# Patient Record
Sex: Female | Born: 1950 | Race: White | Hispanic: No | Marital: Single | Smoking: Never smoker
Health system: Southern US, Community
[De-identification: ages and names within clinical notes are randomized; demographics above are authoritative.]

## PROBLEM LIST (undated history)

## (undated) DIAGNOSIS — R569 Unspecified convulsions: Secondary | ICD-10-CM

## (undated) DIAGNOSIS — F79 Unspecified intellectual disabilities: Secondary | ICD-10-CM

---

## 2008-09-25 ENCOUNTER — Ambulatory Visit: Payer: Self-pay | Admitting: Radiology

## 2008-09-25 ENCOUNTER — Emergency Department (HOSPITAL_BASED_OUTPATIENT_CLINIC_OR_DEPARTMENT_OTHER): Admission: EM | Admit: 2008-09-25 | Discharge: 2008-09-25 | Payer: Self-pay | Admitting: Emergency Medicine

## 2009-12-30 ENCOUNTER — Emergency Department (HOSPITAL_BASED_OUTPATIENT_CLINIC_OR_DEPARTMENT_OTHER): Admission: EM | Admit: 2009-12-30 | Discharge: 2009-12-30 | Payer: Self-pay | Admitting: Emergency Medicine

## 2009-12-30 ENCOUNTER — Ambulatory Visit: Payer: Self-pay | Admitting: Diagnostic Radiology

## 2014-03-24 DIAGNOSIS — G910 Communicating hydrocephalus: Secondary | ICD-10-CM | POA: Insufficient documentation

## 2014-03-24 DIAGNOSIS — F71 Moderate intellectual disabilities: Secondary | ICD-10-CM | POA: Insufficient documentation

## 2014-03-24 DIAGNOSIS — R413 Other amnesia: Secondary | ICD-10-CM | POA: Insufficient documentation

## 2014-03-24 DIAGNOSIS — R262 Difficulty in walking, not elsewhere classified: Secondary | ICD-10-CM | POA: Insufficient documentation

## 2020-07-10 DIAGNOSIS — R911 Solitary pulmonary nodule: Secondary | ICD-10-CM | POA: Insufficient documentation

## 2020-12-23 ENCOUNTER — Emergency Department (HOSPITAL_COMMUNITY): Payer: Medicare Other

## 2020-12-23 ENCOUNTER — Emergency Department (HOSPITAL_COMMUNITY)
Admission: EM | Admit: 2020-12-23 | Discharge: 2020-12-23 | Disposition: A | Discharge status: Home / Self Care | Payer: Medicare Other | Attending: Emergency Medicine | Admitting: Emergency Medicine

## 2020-12-23 ENCOUNTER — Other Ambulatory Visit: Payer: Self-pay

## 2020-12-23 DIAGNOSIS — S7012XA Contusion of left thigh, initial encounter: Secondary | ICD-10-CM | POA: Insufficient documentation

## 2020-12-23 DIAGNOSIS — R519 Headache, unspecified: Secondary | ICD-10-CM | POA: Insufficient documentation

## 2020-12-23 DIAGNOSIS — S79922A Unspecified injury of left thigh, initial encounter: Secondary | ICD-10-CM | POA: Diagnosis present

## 2020-12-23 DIAGNOSIS — W19XXXA Unspecified fall, initial encounter: Secondary | ICD-10-CM | POA: Diagnosis not present

## 2020-12-23 DIAGNOSIS — S50312A Abrasion of left elbow, initial encounter: Secondary | ICD-10-CM | POA: Diagnosis not present

## 2020-12-23 MED ORDER — ACETAMINOPHEN 325 MG PO TABS
325.0000 mg | ORAL_TABLET | Freq: Once | ORAL | Status: AC
  Administered 2020-12-23: 325 mg via ORAL
  Filled 2020-12-23: qty 1

## 2020-12-23 NOTE — ED Notes (Signed)
This NT assisted pt to BR. Pt ambulatory with assistance.

## 2020-12-23 NOTE — ED Notes (Signed)
Patient transported to CT 

## 2020-12-23 NOTE — ED Triage Notes (Signed)
Pt  Here from home with c/o fall , c/o left elbow pain and left leg pain , pt is developmentally delayed

## 2020-12-23 NOTE — ED Provider Notes (Signed)
[facility] EMERGENCY DEPARTMENT Provider Note   CSN: [id number] Arrival date & time: 12/23/20  1609     History No chief complaint on file.   Angela Caldwell is a 70 y.o. female.  She said that she caught her side on the ground.  Pain on the left side of her head, her left elbow, and her left thigh.  The history is provided by the patient. The history is limited by a developmental delay.  Fall This is a new problem. The current episode started less than 1 hour ago. The problem occurs constantly. The problem has not changed since onset.Associated symptoms include headaches. Pertinent negatives include no chest pain, no abdominal pain and no shortness of breath. The symptoms are aggravated by bending. The symptoms are relieved by rest. She has tried nothing for the symptoms. The treatment provided no relief.       No past medical history on file.  There are no problems to display for this patient.      OB History   No obstetric history on file.     No family history on file.     Home Medications Prior to Admission medications   Not on File    Allergies    Patient has no allergy information on record.  Review of Systems   Review of Systems  Constitutional: Negative for chills and fever.  HENT: Negative for ear pain and sore throat.   Eyes: Negative for pain and visual disturbance.  Respiratory: Negative for cough and shortness of breath.   Cardiovascular: Negative for chest pain and palpitations.  Gastrointestinal: Negative for abdominal pain and vomiting.  Genitourinary: Negative for dysuria and hematuria.  Musculoskeletal: Negative for arthralgias and back pain.  Skin: Negative for color change and rash.  Neurological: Positive for headaches. Negative for seizures and syncope.  All other systems reviewed and are negative.   Physical Exam Updated Vital Signs BP 137/79 (BP Location: Right Arm)   Pulse (!) 55   Temp 98.9 F (37.2 C)  (Oral)   Resp 16   SpO2 96%   Physical Exam Vitals and nursing note reviewed.  Constitutional:      General: She is not in acute distress.    Appearance: She is well-developed.  HENT:     Head: Normocephalic and atraumatic.     Nose: Nose normal.     Mouth/Throat:     Comments: No dental trauma Eyes:     Conjunctiva/sclera: Conjunctivae normal.  Cardiovascular:     Rate and Rhythm: Normal rate and regular rhythm.     Heart sounds: No murmur heard.   Pulmonary:     Effort: Pulmonary effort is normal. No respiratory distress.     Breath sounds: Normal breath sounds.  Abdominal:     Palpations: Abdomen is soft.     Tenderness: There is no abdominal tenderness.  Musculoskeletal:     Cervical back: Neck supple.     Comments: Pinpoint abrasion on the posterior of the olecranon on the left.  Elbow range of motion is full and painless.  No effusion.  Distal pulses normal.  Sensation is normal.  On the left thigh, there is a large, oval bruise encompassing most of the lateral aspect of the thigh.  Likewise, the patient has normal range of motion of the hip.  Distal pulses normal.  Sensation is normal.  Gait is normal.  Skin:    General: Skin is warm and dry.  Neurological:  General: No focal deficit present.     Mental Status: She is alert. Mental status is at baseline.  Psychiatric:        Mood and Affect: Mood normal.     ED Results / Procedures / Treatments   Labs (all labs ordered are listed, but only abnormal results are displayed) Labs Reviewed - No data to display  EKG None  Radiology DG Elbow Complete Left  Result Date: 12/23/2020 CLINICAL DATA:  Fall with left elbow pain. EXAM: LEFT ELBOW - COMPLETE 3+ VIEW COMPARISON:  None. FINDINGS: Lateral view is limited by positioning which limits assessment for joint effusion. No large elbow joint effusion is seen. No evidence of acute fracture. Mild degenerative change with spurring. No dislocation. No focal soft tissue  abnormality. IMPRESSION: No acute fracture or subluxation of the left elbow. Mild degenerative change. Electronically Signed   By: Narda Rutherford M.D.   On: 12/23/2020 18:14   CT Head Wo Contrast  Result Date: 12/23/2020 CLINICAL DATA:  Fall EXAM: CT HEAD WITHOUT CONTRAST TECHNIQUE: Contiguous axial images were obtained from the base of the skull through the vertex without intravenous contrast. COMPARISON:  CT brain 08/29/2020, 10/11/2018, 02/02/2017 FINDINGS: Brain: No acute territorial infarction, hemorrhage or intracranial mass is visualized. 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 Moderate severe ventricular enlargement of the lateral and third ventricles with normal size fourth ventricle. 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 No midline shift. Crowding of structures at the foramen magnum. Vascular: No hyperdense vessels.  No unexpected calcification. Skull: No fracture Sinuses/Orbits: No acute finding. Other: None. IMPRESSION: 1. No CT evidence for acute intracranial abnormality. 2. 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 Electronically Signed   By: Jasmine Pang M.D.   On: 12/23/2020 17:42   DG Femur Min 2 Views Left  Result Date: 12/23/2020 CLINICAL DATA:  Status post fall. EXAM: LEFT FEMUR 2 VIEWS COMPARISON:  None. FINDINGS: There is no evidence of fracture or other focal bone lesions. Soft tissues are unremarkable. IMPRESSION: Negative. Electronically Signed   By: Signa Kell M.D.   On: 12/23/2020 18:20    Procedures Procedures   Medications Ordered in ED Medications  acetaminophen (TYLENOL) tablet 325 mg (has no administration in time range)    ED Course  I have reviewed the triage vital signs and  the nursing notes.  Pertinent labs & imaging results that were available during my care of the patient were reviewed by me and considered in my medical decision making (see chart for details).    MDM Rules/Calculators/A&P                          Cathlyn Tersigni sustained a mechanical fall and complained of pain in her head, pain in her left elbow, and pain in her thigh.  ED evaluation was negative for acute trauma.  She will be discharged home. Final Clinical Impression(s) / ED Diagnoses Final diagnoses:  Fall, initial encounter  Abrasion of left elbow, initial encounter  Contusion of left thigh, initial encounter    Rx / DC Orders ED Discharge Orders    None       Koleen Distance, MD 12/23/20 [phone]

## 2022-05-17 IMAGING — CT CT HEAD W/O CM
3 series · 14 of 47 positions shown, 16 images · non-contrast
Comparison: CT brain 08/29/2020, 10/11/2018, 02/02/2017

CLINICAL DATA: Fall

EXAM:
CT HEAD WITHOUT CONTRAST
TECHNIQUE: Contiguous axial images were obtained from the base of the skull
through the vertex without intravenous contrast.

[Series 3: head 5.0 h30s · axial · 0.43mm/px · z∈[-133,+2]mm · 8 of 33 slices shown, 10 images]
[im 3/33  brain]
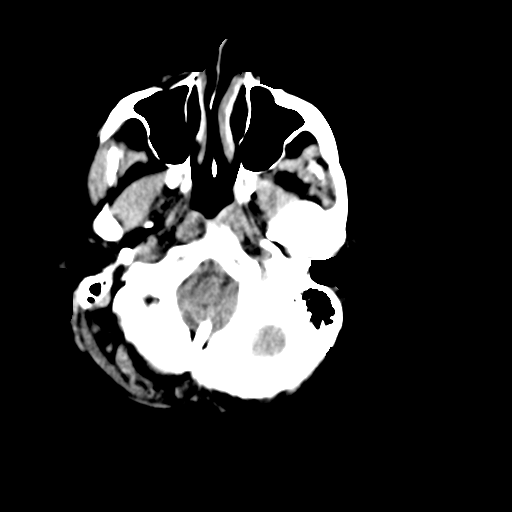
[im 3/33  bone]
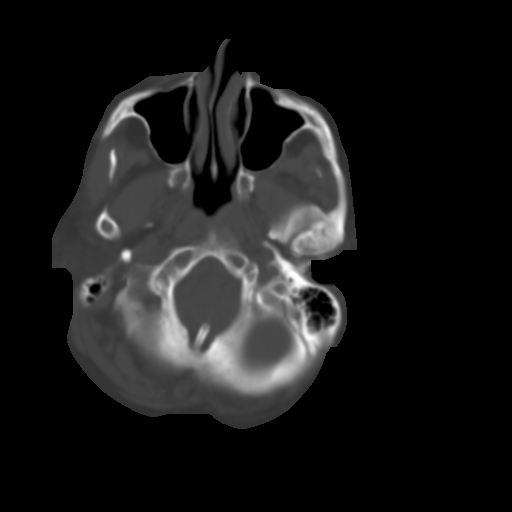
[im 7/33  brain]
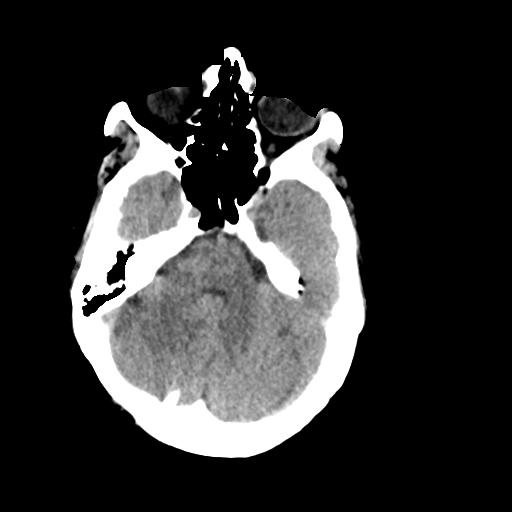
[im 10/33  brain]
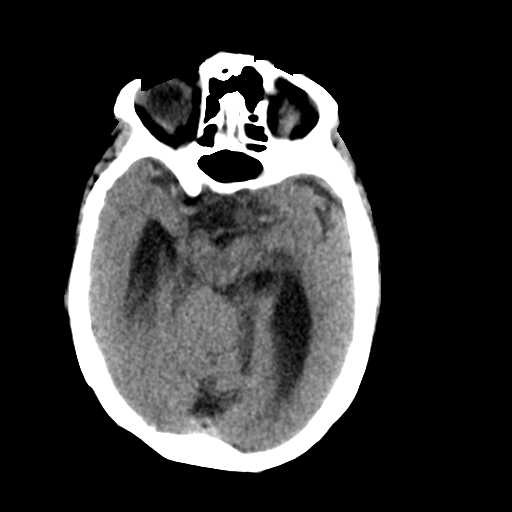
[im 15/33  brain]
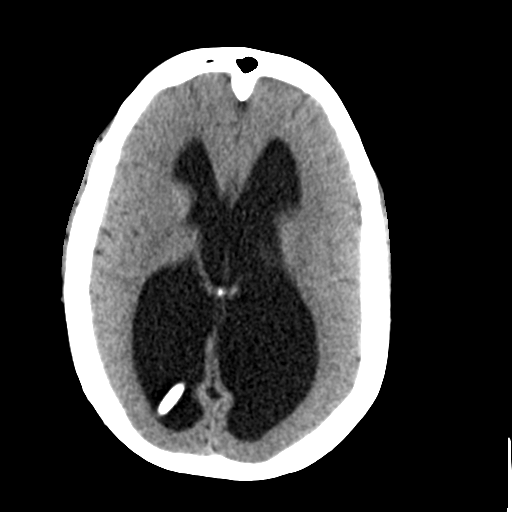
[im 18/33  brain]
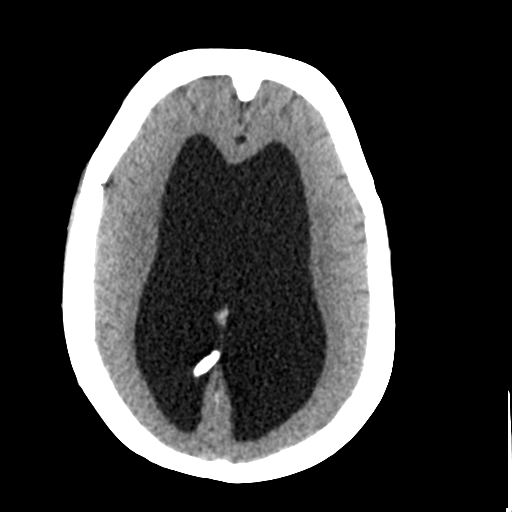
[im 18/33  bone]
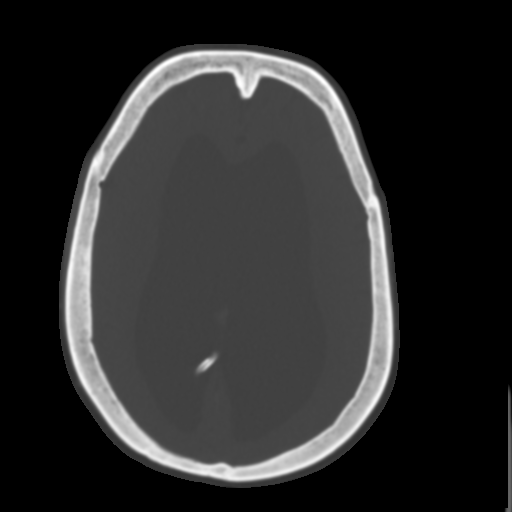
[im 23/33  brain]
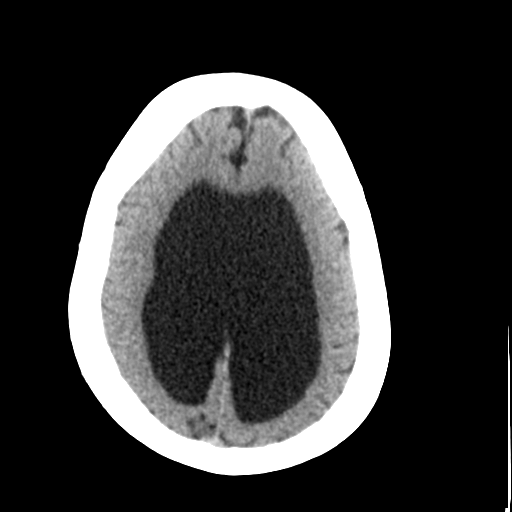
[im 26/33  brain]
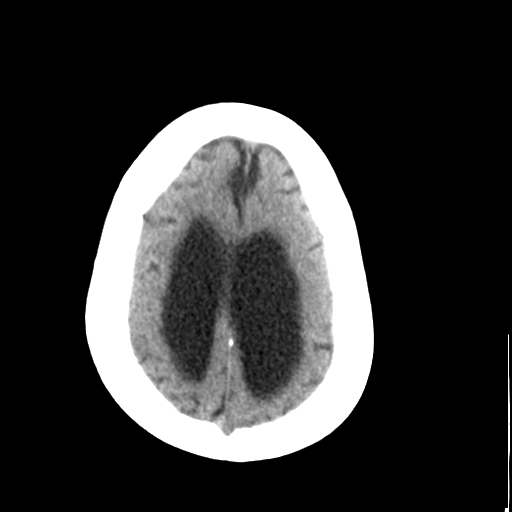
[im 30/33  brain]
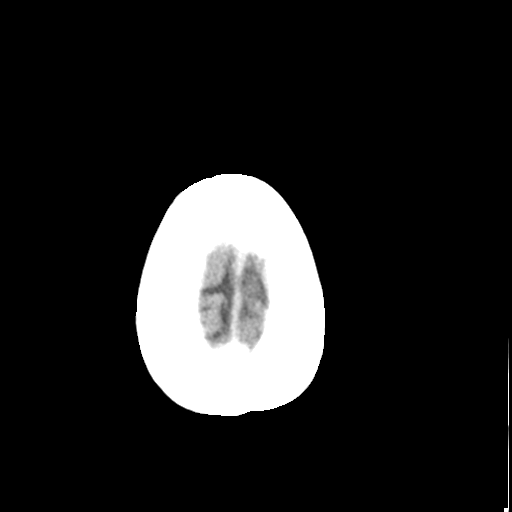

[Series 5: head 3.0 mpr cor · coronal · 0.32mm/px · 3 of 72 slices shown]
[im 24/72  brain]
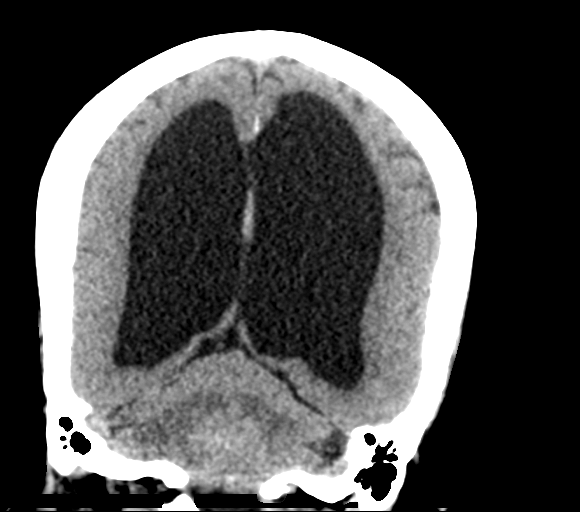
[im 32/72  brain]
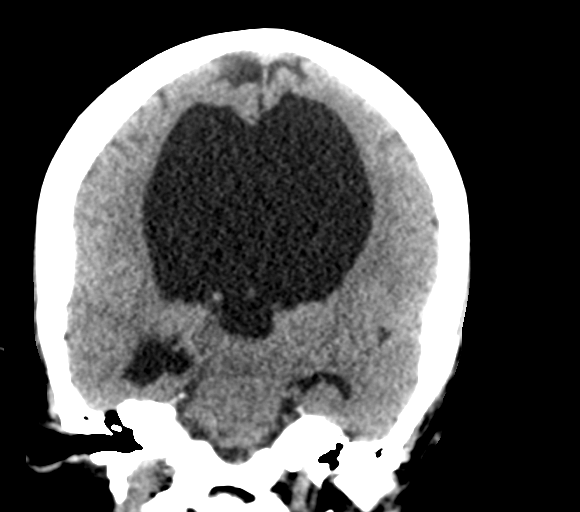
[im 40/72  brain]
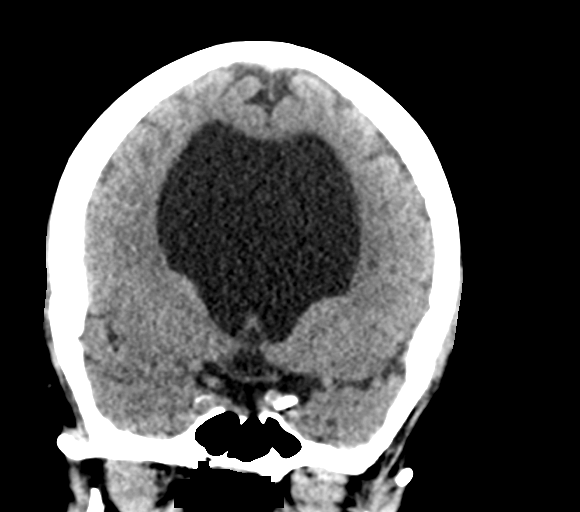

[Series 6: head 3.0 mpr sag · sagittal · 0.32mm/px · 3 of 66 slices shown]
[im 22/66  brain]
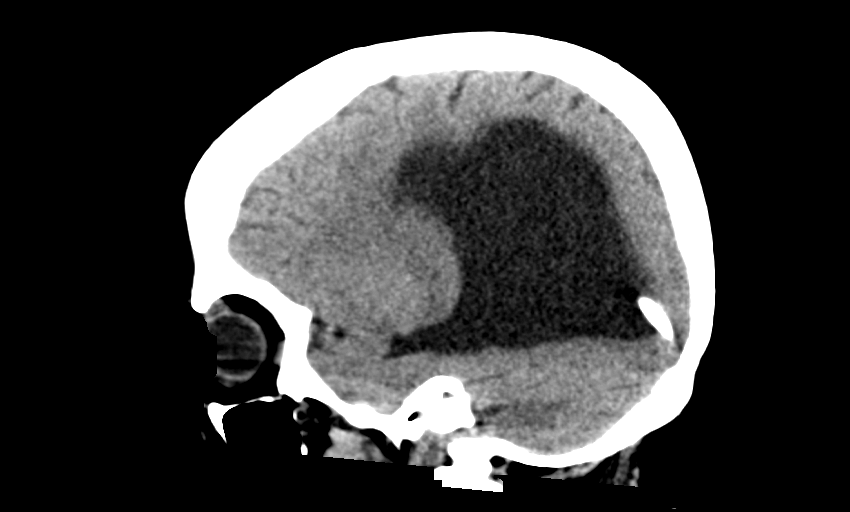
[im 33/66  brain]
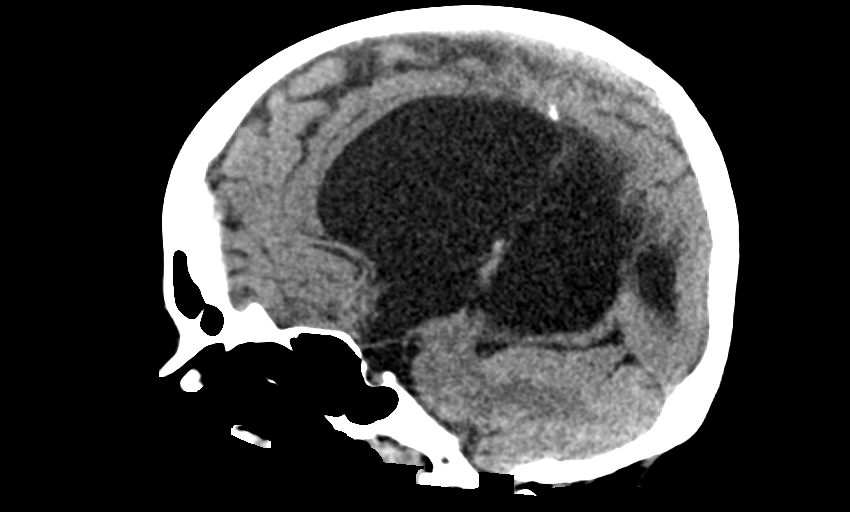
[im 44/66  brain]
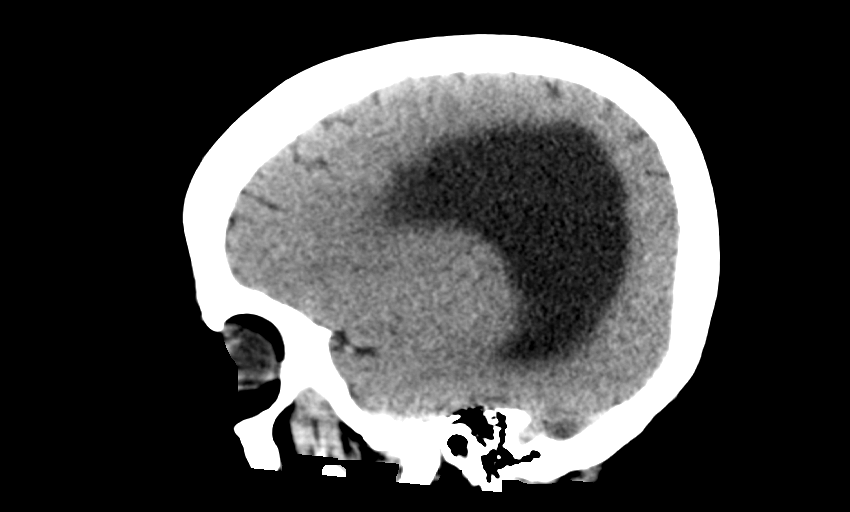

[14 of 47 positions shown; findings below may reference images not displayed]

FINDINGS: Brain: No acute territorial infarction, hemorrhage or intracranial
mass is visualized. Similar positioning of right intracranial shunt
catheter with cranial tip terminating in the posterior right lateral
ventricle and distal tip terminating just beyond foramen magnum.
Moderate severe ventricular enlargement of the lateral and third
ventricles with normal size fourth ventricle. Absent septum
pellucidum as before. No midline shift. Crowding of structures at
the foramen magnum.

Vascular: No hyperdense vessels.  No unexpected calcification.

Skull: No fracture

Sinuses/Orbits: No acute finding.

Other: None.
IMPRESSION: 1. No CT evidence for acute intracranial abnormality.
2. Similar positioning of right intracranial shunt catheter with
moderate to severe ventricular enlargement of the lateral and third
ventricles, chronic. Absent septum pellucidum as before.

## 2023-02-11 ENCOUNTER — Emergency Department (HOSPITAL_COMMUNITY): Payer: Medicare Other

## 2023-02-11 ENCOUNTER — Emergency Department (HOSPITAL_COMMUNITY)
Admission: EM | Admit: 2023-02-11 | Discharge: 2023-02-11 | Disposition: A | Discharge status: Home / Self Care | Payer: Medicare Other | Attending: Emergency Medicine | Admitting: Emergency Medicine

## 2023-02-11 ENCOUNTER — Other Ambulatory Visit: Payer: Self-pay

## 2023-02-11 DIAGNOSIS — S52591A Other fractures of lower end of right radius, initial encounter for closed fracture: Secondary | ICD-10-CM | POA: Diagnosis not present

## 2023-02-11 DIAGNOSIS — S0990XA Unspecified injury of head, initial encounter: Secondary | ICD-10-CM | POA: Diagnosis not present

## 2023-02-11 DIAGNOSIS — S6991XA Unspecified injury of right wrist, hand and finger(s), initial encounter: Secondary | ICD-10-CM | POA: Diagnosis present

## 2023-02-11 DIAGNOSIS — W010XXA Fall on same level from slipping, tripping and stumbling without subsequent striking against object, initial encounter: Secondary | ICD-10-CM | POA: Insufficient documentation

## 2023-02-11 DIAGNOSIS — M25551 Pain in right hip: Secondary | ICD-10-CM | POA: Diagnosis not present

## 2023-02-11 LAB — COMPREHENSIVE METABOLIC PANEL
ALT: 13 U/L (ref 0–44)
AST: 16 U/L (ref 15–41)
Albumin: 3.8 g/dL (ref 3.5–5.0)
Alkaline Phosphatase: 74 U/L (ref 38–126)
Anion gap: 8 (ref 5–15)
BUN: 12 mg/dL (ref 8–23)
CO2: 25 mmol/L (ref 22–32)
Calcium: 8.8 mg/dL — ABNORMAL LOW (ref 8.9–10.3)
Chloride: 105 mmol/L (ref 98–111)
Creatinine, Ser: 0.65 mg/dL (ref 0.44–1.00)
GFR, Estimated: >60 mL/min (ref >60)
Glucose, Bld: 116 mg/dL — ABNORMAL HIGH (ref 70–99)
Potassium: 4 mmol/L (ref 3.5–5.1)
Sodium: 138 mmol/L (ref 135–145)
Total Bilirubin: 0.6 mg/dL (ref 0.3–1.2)
Total Protein: 6.9 g/dL (ref 6.5–8.1)

## 2023-02-11 LAB — CBC
HCT: 39.8 % (ref 36.0–46.0)
Hemoglobin: 13.4 g/dL (ref 12.0–15.0)
MCH: 31.2 pg (ref 26.0–34.0)
MCHC: 33.7 g/dL (ref 30.0–36.0)
MCV: 92.6 fL (ref 80.0–100.0)
Platelets: 205 10*3/uL (ref 150–400)
RBC: 4.3 MIL/uL (ref 3.87–5.11)
RDW: 12.6 % (ref 11.5–15.5)
WBC: 6.5 10*3/uL (ref 4.0–10.5)
nRBC: 0 % (ref 0.0–0.2)

## 2023-02-11 MED ORDER — ACETAMINOPHEN 325 MG PO TABS
650.0000 mg | ORAL_TABLET | Freq: Once | ORAL | Status: AC
  Administered 2023-02-11: 650 mg via ORAL
  Filled 2023-02-11: qty 2

## 2023-02-11 NOTE — Discharge Instructions (Addendum)
It was a pleasure caring for you today. Xray was concerning for right radial fracture. You were placed in a wrist splint today and will need follow up with orthopedic doctor within the next week.  Head imaging was without concern for intracranial bleeding or fractures.   Seek emergency care if experiencing any new or worsening symptoms.

## 2023-02-11 NOTE — Progress Notes (Signed)
Orthopedic Tech Progress Note Patient Details:  Angela Caldwell August 06, 1951 [id number]  Ortho Devices Type of Ortho Device: Sugartong splint Ortho Device/Splint Location: RUE Ortho Device/Splint Interventions: Ordered, Application, Adjustment   Post Interventions Patient Tolerated: Well Instructions Provided: Care of device Patient somewhat difficult to splint as they had some dorsal angulation that prevented me from getting them in the correct anatomical position. Spoke with EDP and they did not plan to reduce, so the wrist was splinted as is.  [name] 02/11/2023, 9:21 PM

## 2023-02-11 NOTE — ED Triage Notes (Addendum)
Per EMS, Pt c/o R hand and R forehead pain r/t a trip and fall this afternoon.  Denies LOC and blood thinners.  Pt has developmental delay and is a poor historian. Pt's sister is on her way.

## 2023-02-11 NOTE — ED Provider Notes (Signed)
[place] EMERGENCY DEPARTMENT AT [facility] Provider Note   CSN: [id number] Arrival date & time: 02/11/23  1812     History  Chief Complaint  Patient presents with   Fall   Hand Pain   Head Injury    Sapna Florence is a 72 y.o. female with PMHx developmental delay who presents to ED after fall complaining of right wrist pain and right hip pain. Patient's sister stating that patient tripped, stumbled and fell onto right side.  Denies chest pain, dyspnea, abdominal pain, vomiting, nausea, loss of consciousness, seizures   Fall  Hand Pain  Head Injury      Home Medications Prior to Admission medications   Not on File      Allergies    Patient has no allergy information on record.    Review of Systems   Review of Systems  Musculoskeletal:        Wrist pain    Physical Exam Updated Vital Signs BP 135/73   Pulse 79   Temp 98.2 F (36.8 C) (Oral)   Resp 18   SpO2 96%  Physical Exam Vitals and nursing note reviewed.  Constitutional:      General: She is not in acute distress. HENT:     Head: Normocephalic and atraumatic.     Mouth/Throat:     Mouth: Mucous membranes are moist.     Pharynx: No oropharyngeal exudate or posterior oropharyngeal erythema.  Eyes:     General: No scleral icterus.       Right eye: No discharge.        Left eye: No discharge.     Conjunctiva/sclera: Conjunctivae normal.  Cardiovascular:     Rate and Rhythm: Normal rate and regular rhythm.     Pulses: Normal pulses.     Heart sounds: Normal heart sounds. No murmur heard. Pulmonary:     Effort: Pulmonary effort is normal. No respiratory distress.     Breath sounds: Normal breath sounds. No wheezing, rhonchi or rales.  Abdominal:     Tenderness: There is no abdominal tenderness.  Musculoskeletal:     Right lower leg: No edema.     Left lower leg: No edema.     Comments: Right hand appears well vascularized with +2 radial pulse and brisk capillary refill   Skin:    General: Skin is warm and dry.     Findings: No rash.  Neurological:     General: No focal deficit present.     Mental Status: She is alert. Mental status is at baseline.     Cranial Nerves: No cranial nerve deficit.     Sensory: No sensory deficit.     Motor: No weakness.     Comments: GCS 15. Speech is goal oriented. No deficits appreciated to CN III-XII; symmetric eyebrow raise, no facial drooping, tongue midline. Sensation to light touch intact. Patient moves extremities without ataxia.    Psychiatric:        Mood and Affect: Mood normal.        Behavior: Behavior normal.     ED Results / Procedures / Treatments   Labs (all labs ordered are listed, but only abnormal results are displayed) Labs Reviewed  COMPREHENSIVE METABOLIC PANEL - Abnormal; Notable for the following components:      Result Value   Glucose, Bld 116 (*)    Calcium 8.8 (*)    All other components within normal limits  CBC    EKG None  Radiology  DG Hand Complete Right  Result Date: 02/11/2023 CLINICAL DATA:  Right hip pain after a fall. Right wrist pain after a fall. Trip and fall this afternoon. EXAM: RIGHT HAND - COMPLETE 3+ VIEW COMPARISON:  Right wrist 02/11/2023 FINDINGS: Transverse fractures of the distal right radius. See additional report of right wrist obtained same day. Degenerative changes in the interphalangeal joints. No additional fractures are identified. Soft tissues are unremarkable. IMPRESSION: 1. Transverse fractures of the distal right radius. See additional report of right wrist. 2. Degenerative changes in the right hand. No additional fractures identified. Electronically Signed   By: Burman Nieves M.D.   On: 02/11/2023 20:05   DG Wrist Complete Right  Result Date: 02/11/2023 CLINICAL DATA:  Right wrist pain after trip and fall injury this afternoon. EXAM: RIGHT WRIST - COMPLETE 3+ VIEW COMPARISON:  None Available. FINDINGS: Transverse fractures of the distal right radial  metaphysis with fracture lines likely extending to the radioulnar joint. No definite extension to the radiocarpal joint. Fracture lines appear impacted. Mild dorsal angulation of the distal fracture fragments. There is overlying soft tissue swelling. IMPRESSION: Mostly transverse impacted fractures of the distal right radial metaphysis. Electronically Signed   By: Burman Nieves M.D.   On: 02/11/2023 20:04   DG Pelvis 1-2 Views  Result Date: 02/11/2023 CLINICAL DATA:  Trip and fall injury this afternoon.  Pain. 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 No evidence of acute fracture or dislocation of the pelvis. No focal bone lesion or bone destruction. SI joints and symphysis pubis are not displaced. Vascular calcifications. IMPRESSION: No acute bony abnormalities. Electronically Signed   By: Burman Nieves M.D.   On: 02/11/2023 20:01   CT Head Wo Contrast  Result Date: 02/11/2023 CLINICAL DATA:  Trip and fall EXAM: CT HEAD WITHOUT CONTRAST CT CERVICAL SPINE WITHOUT CONTRAST TECHNIQUE: Multidetector CT imaging of the head and cervical spine was performed following the standard protocol without intravenous contrast. Multiplanar CT image reconstructions of the cervical spine were also generated. RADIATION DOSE REDUCTION: This exam was performed according to the departmental dose-optimization program which includes automated exposure control, adjustment of the mA and/or kV according to patient size and/or use of iterative reconstruction technique. COMPARISON:  02/28/2022 CT head, 12/18/2021 CT cervical spine FINDINGS: CT HEAD FINDINGS Brain: No evidence of acute infarct, hemorrhage, mass, mass effect, or midline shift. No hydrocephalus or extra-axial fluid collection. Unchanged severe hydrocephalus, with right posterior ventriculostomy catheter again noted. Vascular: No hyperdense vessel.  Skull: Negative for fracture or focal lesion. Sinuses/Orbits: No acute finding. Other: The mastoid air cells are well aerated. CT CERVICAL SPINE FINDINGS Alignment: No traumatic listhesis. Skull base and vertebrae: No acute fracture or suspicious osseous lesion. Fusion of C3 and C4, which may be congenital or degenerative. Soft tissues and spinal canal: No prevertebral fluid or swelling. No visible canal hematoma. Disc levels: Degenerative changes in the cervical spine. No significant spinal canal stenosis. Upper chest: Negative. IMPRESSION: 1. No acute intracranial process. 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 2. No acute fracture or traumatic listhesis in the cervical spine. Electronically Signed   By: Wiliam Ke M.D.   On: 02/11/2023 19:47   CT Cervical Spine Wo Contrast  Result Date: 02/11/2023 CLINICAL DATA:  Trip and fall EXAM: CT HEAD WITHOUT CONTRAST CT CERVICAL SPINE WITHOUT CONTRAST TECHNIQUE: Multidetector CT imaging of the head and cervical spine was  performed following the standard protocol without intravenous contrast. Multiplanar CT image reconstructions of the cervical spine were also generated. RADIATION DOSE REDUCTION: This exam was performed according to the departmental dose-optimization program which includes automated exposure control, adjustment of the mA and/or kV according to patient size and/or use of iterative reconstruction technique. COMPARISON:  02/28/2022 CT head, 12/18/2021 CT cervical spine FINDINGS: CT HEAD FINDINGS Brain: No evidence of acute infarct, hemorrhage, mass, mass effect, or midline shift. No hydrocephalus or extra-axial fluid collection. Unchanged severe hydrocephalus, with right posterior ventriculostomy catheter again noted. Vascular: No hyperdense vessel. Skull: Negative for fracture or focal lesion. Sinuses/Orbits: No acute finding. Other: The mastoid air cells are well aerated. CT CERVICAL SPINE FINDINGS Alignment: No  traumatic listhesis. Skull base and vertebrae: No acute fracture or suspicious osseous lesion. Fusion of C3 and C4, which may be congenital or degenerative. Soft tissues and spinal canal: No prevertebral fluid or swelling. No visible canal hematoma. Disc levels: Degenerative changes in the cervical spine. No significant spinal canal stenosis. Upper chest: Negative. IMPRESSION: 1. No acute intracranial process. 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 2. No acute fracture or traumatic listhesis in the cervical spine. Electronically Signed   By: Wiliam Ke M.D.   On: 02/11/2023 19:47    Procedures Procedures    Medications Ordered in ED Medications  acetaminophen (TYLENOL) tablet 650 mg (650 mg Oral Given 02/11/23 1936)    ED Course/ Medical Decision Making/ A&P                             Medical Decision Making Amount and/or Complexity of Data Reviewed Labs: ordered. Radiology: ordered.  Risk OTC drugs.   This patient presents to the ED after a fall, this involves an extensive number of treatment options, and is a complaint that carries with it a high risk of complications and morbidity.  The differential diagnosis includes  intracranial hemorrhage, subdural/epidural hematoma, vertebral fracture, spinal cord injury, muscle strain, skull fracture, fracture.   Co morbidities that complicate the patient evaluation  Developmental disorder     Lab Tests:  I Ordered, and personally interpreted labs.  The pertinent results include:   -CMP: no concern for electrolyte abnormality; no concern for kidney/liver damage -CBC: No concern for anemia or leukocytosis    Imaging Studies ordered:  I ordered imaging studies including  -CT head/cervical spine: No acute intracranial process. 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 No acute fracture or traumatic listhesis in the cervical spine. -Hip xray: No acute bony  abnormalities.  -Wrist xray: Mostly transverse impacted fractures of the distal right radial metaphysis. -hand xray: Degenerative changes in the right hand. No additional fractures identified. I independently visualized and interpreted imaging I agree with the radiologist interpretation     Problem List / ED Course / Critical interventions / Medication management  Patient presented for Fall. Complaining of right wrist and right hip pain. Patient with stable vitals and does not appear to be in distress. Patient with unremarkable neuro exam.  CT without concerns for intracranial bleeding or fractures.  Patient complaining of right wrist pain and right hip pain.  Hip x-ray without concern for fracture.  Right wrist x-ray concerning for transverse impacted fracture of distal right radial metaphysis.  Right sugar-tong splint applied to right wrist.  After application of splint patient's right hand appeared neurovascularly intact. Provided patient and her sister with information to follow-up with hand surgeon.  Educated patient and sister on return precautions to include swelling of right hand, blue fingers, numbness of right hand.  Patient's sister verbally endorsed understanding of plan. I have reviewed the patients home medicines and have made adjustments as needed Patient was given return precautions. Patient stable for discharge at this time. Patient verbalized understanding of plan.   DDx: These are considered less likely due to history of present illness and physical exam findings -Intracranial hemorrhage, subdural/epidural hematoma: Canadian head CT score of 0, no neurodeficits -Vertebral fracture: No seatbelt sign, no midline tenderness, no step-off/crepitus/abnormalities palpated -Spinal cord injury: Nexus C-spine and Canadian head CT score of 0, no neurodeficits -Skull fracture: No postauricular ecchymosis, no periorbital ecchymosis, no hemotympanum -Fracture: No  step-offs/crepitus/abnormalities palpated in head, neck, chest, upper extremities, lower extremities, pelvis  Risk Stratification Score:  Nexus C-spine: imaging obtained Canadian Head CT: imaging obtained   Social Determinants of Health:  Developmental delay              Final Clinical Impression(s) / ED Diagnoses Final diagnoses:  Other closed fracture of distal end of right radius, initial encounter    Rx / DC Orders ED Discharge Orders     None         Margarita Rana 02/11/23 2141    Benjiman Core, MD 02/12/23 0003

## 2023-02-18 ENCOUNTER — Encounter (HOSPITAL_COMMUNITY): Payer: Self-pay

## 2023-02-18 ENCOUNTER — Emergency Department (HOSPITAL_COMMUNITY)
Admission: EM | Admit: 2023-02-18 | Discharge: 2023-02-18 | Disposition: A | Discharge status: Home / Self Care | Payer: Medicare Other | Attending: Emergency Medicine | Admitting: Emergency Medicine

## 2023-02-18 ENCOUNTER — Other Ambulatory Visit: Payer: Self-pay

## 2023-02-18 DIAGNOSIS — R0789 Other chest pain: Secondary | ICD-10-CM | POA: Insufficient documentation

## 2023-02-18 HISTORY — DX: Unspecified convulsions: R56.9

## 2023-02-18 HISTORY — DX: Unspecified intellectual disabilities: F79

## 2023-02-18 LAB — CBC WITH DIFFERENTIAL/PLATELET
Abs Immature Granulocytes: 0.03 10*3/uL (ref 0.00–0.07)
Basophils Absolute: 0.1 10*3/uL (ref 0.0–0.1)
Basophils Relative: 1 %
Eosinophils Absolute: 0.2 10*3/uL (ref 0.0–0.5)
Eosinophils Relative: 2 %
HCT: 39.2 % (ref 36.0–46.0)
Hemoglobin: 13.2 g/dL (ref 12.0–15.0)
Immature Granulocytes: 0 %
Lymphocytes Relative: 36 %
Lymphs Abs: 2.6 10*3/uL (ref 0.7–4.0)
MCH: 30.8 pg (ref 26.0–34.0)
MCHC: 33.7 g/dL (ref 30.0–36.0)
MCV: 91.6 fL (ref 80.0–100.0)
Monocytes Absolute: 0.7 10*3/uL (ref 0.1–1.0)
Monocytes Relative: 10 %
Neutro Abs: 3.7 10*3/uL (ref 1.7–7.7)
Neutrophils Relative %: 51 %
Platelets: 239 10*3/uL (ref 150–400)
RBC: 4.28 MIL/uL (ref 3.87–5.11)
RDW: 12.6 % (ref 11.5–15.5)
WBC: 7.3 10*3/uL (ref 4.0–10.5)
nRBC: 0 % (ref 0.0–0.2)

## 2023-02-18 LAB — BASIC METABOLIC PANEL
Anion gap: 9 (ref 5–15)
BUN: 10 mg/dL (ref 8–23)
CO2: 23 mmol/L (ref 22–32)
Calcium: 8.6 mg/dL — ABNORMAL LOW (ref 8.9–10.3)
Chloride: 101 mmol/L (ref 98–111)
Creatinine, Ser: 0.68 mg/dL (ref 0.44–1.00)
GFR, Estimated: >60 mL/min (ref >60)
Glucose, Bld: 75 mg/dL (ref 70–99)
Potassium: 3.8 mmol/L (ref 3.5–5.1)
Sodium: 133 mmol/L — ABNORMAL LOW (ref 135–145)

## 2023-02-18 LAB — TROPONIN I (HIGH SENSITIVITY): Troponin I (High Sensitivity): 2 ng/L (ref <18)

## 2023-02-18 MED ORDER — ASPIRIN 81 MG PO CHEW
162.0000 mg | CHEWABLE_TABLET | Freq: Once | ORAL | Status: AC
  Administered 2023-02-18: 162 mg via ORAL
  Filled 2023-02-18: qty 2

## 2023-02-18 MED ORDER — ALUM & MAG HYDROXIDE-SIMETH 200-200-20 MG/5ML PO SUSP
30.0000 mL | Freq: Once | ORAL | Status: AC
  Administered 2023-02-18: 30 mL via ORAL
  Filled 2023-02-18: qty 30

## 2023-02-18 MED ORDER — ONDANSETRON 4 MG PO TBDP
ORAL_TABLET | ORAL | Status: AC
  Administered 2023-02-18: 4 mg
  Filled 2023-02-18: qty 1

## 2023-02-18 MED ORDER — ONDANSETRON HCL 4 MG/2ML IJ SOLN
4.0000 mg | Freq: Once | INTRAMUSCULAR | Status: DC
  Filled 2023-02-18: qty 2

## 2023-02-18 MED ORDER — ACETAMINOPHEN 325 MG PO TABS
650.0000 mg | ORAL_TABLET | Freq: Once | ORAL | Status: AC
  Administered 2023-02-18: 650 mg via ORAL
  Filled 2023-02-18: qty 2

## 2023-02-18 NOTE — ED Provider Notes (Signed)
[place] EMERGENCY DEPARTMENT AT [facility] Provider Note   CSN: [id number] Arrival date & time: 02/18/23  4098     History  Chief Complaint  Patient presents with   Chest Pain   Rib Injury    Angela Caldwell is a 72 y.o. female.  HPI  Pt comes in with cc of chest discomfort. Patient comes from group home with her sister at the bedside.  According to the patient's sister, patient was complaining of chest pain all day at the nursing home, therefore they sent her to the emergency room.  Patient currently indicates that she is having mostly right-sided arm pain and that also right-sided chest pain.  She states that the pain has been constant.  Arm pain is worse.  She denies any exertional component to the pain.  She denies any cough, fevers, chills, shortness of breath.  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  Home Medications Prior to Admission medications   Not on File      Allergies    Patient has no known allergies.    Review of Systems   Review of Systems  Physical Exam Updated Vital Signs BP 135/84   Pulse 69   Temp 98.2 F (36.8 C) (Oral)   Resp 20   SpO2 98%  Physical Exam Vitals and nursing note reviewed.  Constitutional:      Appearance: She is well-developed.  HENT:     Head: Normocephalic and atraumatic.  Eyes:     Extraocular Movements: Extraocular movements intact.  Cardiovascular:     Rate and Rhythm: Normal rate.     Heart sounds: Normal heart sounds.  Pulmonary:     Effort: Pulmonary effort is normal.  Musculoskeletal:     Cervical back: Normal range of motion and neck supple.  Skin:    General: Skin is dry.  Neurological:     Mental Status: She is alert and oriented to person, place, and time.     ED Results / Procedures / Treatments   Labs (all labs ordered are listed, but only abnormal results are displayed) Labs Reviewed  BASIC METABOLIC PANEL - Abnormal; Notable for  the following components:      Result Value   Sodium 133 (*)    Calcium 8.6 (*)    All other components within normal limits  CBC WITH DIFFERENTIAL/PLATELET  TROPONIN I (HIGH SENSITIVITY)  TROPONIN I (HIGH SENSITIVITY)    EKG EKG Interpretation  Date/Time:  Monday February 18 2023 19:30:04 EDT Ventricular Rate:  67 PR Interval:  118 QRS Duration: 80 QT Interval:  452 QTC Calculation: 478 R Axis:   71 Text Interpretation: Sinus rhythm Borderline short PR interval Low voltage, precordial leads Abnormal inferior Q waves Nonspecific T abnrm, anterolateral leads No old tracing to compare Confirmed by Derwood Kaplan [phone]) on 02/18/2023 9:00:55 PM   EKG Interpretation  Date/Time:  Monday February 18 2023 21:08:45 EDT Ventricular Rate:  57 PR Interval:  103 QRS Duration: 84 QT Interval:  447 QTC Calculation: 436 R Axis:   63 Text Interpretation: Sinus rhythm Short PR interval Low voltage, precordial leads No acute changes Confirmed by Derwood Kaplan [phone]) on 02/18/2023 10:43:58 PM         Radiology No results found.  Procedures Procedures    Medications Ordered in ED Medications  ondansetron (ZOFRAN) injection 4 mg (4 mg Intravenous Patient Refused/Not Given 02/18/23 2136)  aspirin chewable tablet 162 mg (162 mg Oral Given 02/18/23  2136)  alum & mag hydroxide-simeth (MAALOX/MYLANTA) 200-200-20 MG/5ML suspension 30 mL (30 mLs Oral Given 02/18/23 2136)  acetaminophen (TYLENOL) tablet 650 mg (650 mg Oral Given 02/18/23 2136)  ondansetron (ZOFRAN-ODT) 4 MG disintegrating tablet (4 mg  Given 02/18/23 2137)    ED Course/ Medical Decision Making/ A&P                             Medical Decision Making Amount and/or Complexity of Data Reviewed Labs: ordered.  Risk OTC drugs. Prescription drug management.   72 year old patient comes in with chief complaint of chest pain.  Patient is not the best historian, sister is at the bedside.  Patient has no known history of CAD.  She  does not smoke, have any hypertension or hyperlipidemia.  There is family history of CAD however.  Patient has been having chest pain since this morning.  Plan is to get single troponin, if it is negative then patient can be discharged.  Patient's hear score is less than 3.  Differential diagnosis essentially includes ACS, GERD, musculoskeletal pain.  Additionally patient is primarily complaining of right arm pain, she already has known history of fracture there.  Final Clinical Impression(s) / ED Diagnoses Final diagnoses:  Atypical chest pain    Rx / DC Orders ED Discharge Orders     None         Derwood Kaplan, MD 02/18/23 2250

## 2023-02-18 NOTE — Discharge Instructions (Signed)

## 2023-02-18 NOTE — ED Notes (Addendum)
Pt caregiver had to return to group home, stated that pt sister is her POA, and can be called for any questions regarding medical care. Spoke with sister, Steward Drone, and she states that she will be on her way to the ED soon

## 2023-02-18 NOTE — ED Triage Notes (Addendum)
Pt arrives with caregiver from group home for chest pain and right side rib pain from a fall 2 days ago.

## 2024-03-20 ENCOUNTER — Encounter: Payer: Self-pay | Admitting: Advanced Practice Midwife

## 2024-05-05 NOTE — ED Provider Notes (Signed)
 [facility] Emergency Department Provider Note  MRN: [id number] Chief Complaint:  Chief Complaint  Patient presents with  . Fall  . Laceration    History and Review of Systems  Angela Caldwell is a 73 y.o. year-old female with a medical history discussed below presenting to the ED with chief complaint as above.   Patient brought in by EMS from group home who assisted with patient history. EMS report that the patient was found on the floor of her bathroom with a laceration to the right side of her head. Staff report that the patient is at her baseline, but notes that her speech is not slurred, but does sound different. EMS state that the patient did not have any other focal deficits since arrival on scene. EMS endorses a history of intellectual disability. They deny any history of anticoagulant use in the patient. EMS note that the patient seems to not be opening her mouth all the way when she speaks and feel this might be impacting her speech. EMS report that on scene the patient was holding her head and seemed to be in pain, and were able to control the bleeding from her head laceration.   Review of Systems A pertinent review of systems was obtained and was negative except as noted in the HPI and MDM. Surgical History[1] Medical History[2]  Physical Exam  Physical Exam Vitals and nursing note reviewed.  Constitutional:      Appearance: She is not ill-appearing.  HENT:     Head: Normocephalic. Laceration present.     Comments: 1 cm laceration just superior to right ear that is oozing blood    Mouth/Throat:     Mouth: Mucous membranes are moist.     Pharynx: Oropharynx is clear.  Eyes:     Extraocular Movements: Extraocular movements intact.  Cardiovascular:     Rate and Rhythm: Normal rate.     Heart sounds: Normal heart sounds.  Pulmonary:     Effort: Pulmonary effort is normal.     Breath sounds: Normal breath sounds.  Abdominal:     General: Abdomen is flat. There is no  distension.     Palpations: Abdomen is soft.     Tenderness: There is no abdominal tenderness.  Musculoskeletal:        General: Normal range of motion.     Cervical back: Normal range of motion.  Skin:    General: Skin is warm and dry.     Capillary Refill: Capillary refill takes less than 2 seconds.     Findings: No rash.  Neurological:     General: No focal deficit present.     Mental Status: She is alert. Mental status is at baseline.     Cranial Nerves: Dysarthria present.     Comments: Cognitive delay, mild dysarthria, but oriented and follows commands, baseline.   Psychiatric:        Mood and Affect: Mood normal.        Behavior: Behavior normal.       Procedures   Laceration repair  Date/Time: 05/05/2024 5:35 PM  Performed by: Paulina Earnie Easterly, MD Authorized by: Paulina Earnie Easterly, MD   Consent:    Consent obtained:  Verbal   Consent given by:  Guardian   Risks, benefits, and alternatives were discussed: yes     Risks discussed:  Infection, pain and poor cosmetic result   Alternatives discussed:  No treatment and delayed treatment Universal protocol:    Procedure explained and questions  answered to patient or proxy's satisfaction: yes     Imaging studies available: yes     Required blood products, implants, devices, and special equipment available: yes     Site/side marked: yes     Immediately prior to procedure, a time out was called: yes     Patient identity confirmed:  Verbally with patient and arm band Anesthesia:    Anesthesia method:  None Laceration details:    Location:  Scalp   Scalp location:  R temporal   Length (cm):  1 Pre-procedure details:    Preparation:  Patient was prepped and draped in usual sterile fashion Exploration:    Hemostasis achieved with:  Direct pressure   Imaging obtained: x-ray     Imaging outcome: foreign body not noted     Wound exploration: wound explored through full range of motion and entire depth of wound  visualized   Treatment:    Area cleansed with:  Saline   Amount of cleaning:  Standard   Irrigation solution:  Sterile saline   Irrigation volume:  25 mL   Irrigation method:  Syringe   Debridement:  None   Undermining:  None   Scar revision: no   Skin repair:    Repair method:  Staples   Number of staples:  1 Approximation:    Approximation:  Close Repair type:    Repair type:  Simple Post-procedure details:    Dressing:  Antibiotic ointment and non-adherent dressing   Procedure completion:  Tolerated well, no immediate complications   ED MEDS: Medications  diphtheria-pertussis-tetanus (BOOSTRIX, Tdap) vaccine 0.5 mL (0.5 mL intramuscular Given 05/05/24 1809)  acetaminophen  (TYLENOL ) tablet 1,000 mg (1,000 mg oral Given 05/05/24 1805)      Medical Decision Making   ED Course as of 05/06/24 0931  Tue May 05, 2024  1755 I reviewed last neurology note from 8/21.  Patient was having reportedly some seizure-like activity at that time.  EEG did not show any seizure activity and patient is already on Depakote.  No medication changes were made.  Patient is noted to have dysarthria at baseline in that note and her exam today is consistent with exam documented by neurology at that time.  She has no apparent acute deficits but does have a known history of waxing and waning neurologic deficits and undergone extensive evaluation.  Neurology did not recommend additional MRI at that time and I do not think it is indicated at this time either.  CT noncontrasted study and shunt series will be obtained to evaluate for traumatic injury. [AZ]  1756 Tetanus shot updated [AZ]  1756 Headache treated with Tylenol  [AZ]    ED Course User Index [AZ] Paulina Earnie Easterly, MD   Laceration repaired as above with staples after cleaning.  Tetanus updated.  Patient tolerated the procedure well.  Family at bedside and we discussed the plan.  They agree patient is at her baseline now.  CT of the head is consistent  with patient's baseline with significant hydrocephalus and shunt in the same place but no worsening hydrocephalus or evidence of traumatic injury or skull fracture.  Shunt series also reveals intact shunt.  I did send an inbox message to the PA that most recently saw the patient in the neurosurgery clinic to help expedite follow-up.  I feel that patient may need some kind of procedure to help improve her hydrocephalus and the symptoms she is now experiencing from them which are likely contributing to her falls, but no  indication at this time for urgent intervention.   Strict return precautions were provided to the patient. Patient was encouraged to return to the ED should they experience worsening or persistence of current symptoms, or should they develop new concerning symptoms including but not limited to chest pain, shortness of breath, dizziness, syncope, focal weakness, or inability to tolerate po. Encouraged them to f/u with their PCP on an outpatient basis. Questions regarding the diagnosis were answered, and side effects regarding therapies were provided in writing or orally. Patient discharged in stable condition.   Ddx includes but not limited to: skull fracture, shunt malfunction, stroke, ICH   Clinical Complexity:  I reviewed nursing and triage notes  I personally ordered and reviewed the patient's Labs  and Imaging and my interpretation is as above in ED course  Interventions ordered and performed by myself as noted in ED course  Patient management required discussion with the following services or consulting groups as above in ED course: none   Past medical/surgical history that increases complexity of ED encounter:  hx hydrocephalus with shunt  Further history obtained from: EMS, Family , and Clinic note as stated in MDM and HPI   Patient's presentation is most consistent with acute presentation with potential threat to life or bodily function.  Final Clinical Impressions(s) 1.  Fall, initial encounter     ED Disposition:  Discharge   ED Prescriptions   None      This document serves as a record of services personally performed by Paulina Easterly, MD. It was created on their behalf by Karena LOISE Hurst, Scribe, a trained medical scribe. The creation of this record is the provider's dictation and/or activities during the visit.   Electronically signed by: Karena LOISE Hurst, Scribe 05/05/2024 9:31 AM       [1] Past Surgical History: Procedure Laterality Date  . CSF SHUNT     Procedure: CSF SHUNT  . LAPAROSCOPIC CHOLECYSTECTOMY W/ CHOLANGIOGRAPHY N/A 07/14/2020   Procedure: CHOLECYSTECTOMY LAPAROSCOPIC W/ CHOLANGIOGRAM;  Surgeon: Prentice Tanda Pizza, MD;  Location: [facility] MAIN OR;  Service: General;  Laterality: N/A;  . VENTRICULOPERITONEAL SHUNT     Procedure: VENTRICULOPERITONEAL SHUNT  [2] Past Medical History: Diagnosis Date  . Communicating hydrocephalus    (CMD)   . Difficulty in walking   . Headache, tension-type   . Memory loss   . Moderate intellectual disabilities

## 2024-06-19 NOTE — ED Provider Notes (Signed)
 [facility] Emergency Department Emergency Department Provider Note  Provider at Bedside:  06/19/2024 1:41 PM  Chief Complaint: Fall, head trauma  History of Present Illness:  History obtained from: Patient Independent history obtained from: Facility staff, as patient is unable to provide adequate history due to patient's mental acuity.   Angela Caldwell is a 73 y.o. female with PMHx of seizures, memory loss, moderate intellectual disabilities, and headaches who presents to the ED from group home with complaints of unwitnessed fall. Patient states the top of her head hurts. Facility staff suspected patient fell while trying to go to the bathroom. Patient was placed in c-collar en route to this ED. Patient does have noted history of multiple falls.  Patient denies any other pain or discomfort at this time.  ______________________ ROS: Pertinent positives and negatives per HPI. Pertinent past medical, surgical, social and family history records were reviewed. Current Medications and Allergies were reviewed.  Physical Exam   Vitals:   06/19/24 1250 06/19/24 1253  BP: (!) 165/71   BP Location: Left arm   Patient Position: Lying   Pulse: 61   Resp: 16   Temp: 98.4 F (36.9 C)   TempSrc: Oral   SpO2: 96% 96%  Weight: 54.4 kg (120 lb)   Height: 152.4 cm (5')       Physical Exam Vitals and nursing note reviewed.  Constitutional:      General: She is not in acute distress.    Appearance: Normal appearance.  HENT:     Head: Normocephalic.     Comments: Mid-occipital scalp and right frontal scalp is tender to palpation     Right Ear: External ear normal.     Left Ear: External ear normal.     Nose: Nose normal.     Mouth/Throat:     Mouth: Mucous membranes are moist.  Eyes:     Pupils: Pupils are equal, round, and reactive to light.  Cardiovascular:     Rate and Rhythm: Normal rate and regular rhythm.     Heart sounds: Normal heart sounds.  Pulmonary:      Effort: Pulmonary effort is normal.     Breath sounds: Normal breath sounds.  Abdominal:     Palpations: Abdomen is soft.     Tenderness: There is no abdominal tenderness.  Musculoskeletal:        General: Normal range of motion.     Cervical back: Normal range of motion.  Skin:    General: Skin is warm.  Neurological:     General: No focal deficit present.     Mental Status: She is alert and oriented to person, place, and time.  Psychiatric:        Mood and Affect: Mood normal.        Behavior: Behavior is cooperative.        Thought Content: Thought content normal.        Cognition and Memory: Memory is impaired.     Results   EKG Impression:  My Interpretation: none  Labs: Lab Results (last 24 hours)     ** No results found for the last 24 hours. **       None performed  CXR Impression: (Interpreted by me) None performed.  Impression of additional imaging studies include: CT of the head and cervical spine were performed and did not show any acute traumatic injuries and patient's shunt was in good position.  Imaging: Radiology Results (last 72 hours)  Procedure Component Value Units Date/Time   CT Spine Cervical WO Contrast - Preliminary [[id number]] Collected: 06/19/24 1350   Order Status: Completed Updated: 06/19/24 1353   This result has not been signed. Information might be incomplete.     Narrative:     CT CERVICAL SPINE WITHOUT CONTRAST, 06/19/2024 1:42 PM  INDICATION: fall, chin tenderness   COMPARISON: CT cervical spine 05/05/2024  TECHNIQUE: Thin-section axial CT images of the entire cervical spine were acquired without contrast. Supplemental 2D reformatted images were generated and reviewed as needed.  All CT scans at [facility] and [facility] are performed using radiation dose optimization techniques as appropriate to a performed exam, including but not limited to one or more of the following: automatic  exposure control, adjustment of the mA and/or kV according to patient size, use of iterative reconstruction technique. In addition, our institution participates in a radiation dose monitoring program to optimize patient radiation exposure.  LEVELS IMAGED: Foramen magnum to upper thoracic region.  FINDINGS: Alignment: No acute traumatic malalignment.  Craniocervical junction: No evidence of acute fracture or dislocation. 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  Vertebrae: No acute fractures. Vertebral body heights maintained. Similar incomplete segmentation of the C3, C4, and C5 vertebrae.  Degenerative changes: Similar multilevel degenerative changes.    Impression:     No evidence of acute fracture or traumatic malalignment of the cervical spine.   CT Head WO Contrast - Preliminary [[id number]] Collected: 06/19/24 1345   Order Status: Completed Updated: 06/19/24 1353   This result has not been signed. Information might be incomplete.     Narrative:     CT HEAD WITHOUT CONTRAST, 06/19/2024 1:42 PM  INDICATION: fall   COMPARISON: CT head 05/05/2024  TECHNIQUE: Axial CT images of the brain from skull base to vertex, including portions of the face and sinuses, were obtained without contrast. Supplemental 2D reformatted images were generated and reviewed as needed.  All CT scans at [facility] and [facility] are performed using radiation dose optimization techniques as appropriate to a performed exam, including but not limited to one or more of the following: automatic exposure control, adjustment of the mA and/or kV according to patient size, use of iterative reconstruction technique. In addition, our institution participates in a radiation dose monitoring program to optimize patient radiation exposure.  FINDINGS:  Calvarium/skull base: No evidence of acute fracture or destructive lesion. Mastoids and middle ears demonstrate no substantial  mucosal disease. Right parietal burr hole. Small right parietal scalp contusion.  Paranasal sinuses: No air fluid levels.  Brain: No acute intracranial hemorrhage. No large vascular territory infarct. Redemonstrated constellation of findings including lateral and third ventriculomegaly, partial agenesis of the corpus callosum, absent septum pellucidum, small posterior fossa with low-lying torcula, and tectal beaking. Right parietal approach ventricular shunt catheter tip is similarly positioned with one of the hands terminating in the posterior body of the right lateral ventricle and the other and terminating in the posterior aspect of the thecal sac upper cervical spinal canal. Overall similar size and morphology of the ventricular system compared to recent head CT.    Impression:     1.  No acute intracranial hemorrhage or large vascular territory infarct. 2.  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 Overall similar positioning of a CSF shunt catheter and similar size and morphology of ventricular system compared to the CT head from 05/05/2024 without  findings to suggest progressive hydrocephalus.          Procedures   Procedures  Evidence Based Calculators      ED Course   ED Course as of 06/19/24 1424  Fri Jun 19, 2024  1344 Patient's initial vital signs are stable. [LT]  1344 Intervention-patient with getting CT of her head and cervical spine.  Patient had a fall at the group home and hit the top of her head on the front of her head.  Patient denies any other injuries.  Patient has had history of multiple falls in the past. [LT]  1405 Patient CT head and CT cervical spine did not show any acute findings and patient's shunt was still in good position with no evidence of change in her hydrocephalus. [LT]  1406 Disposition-discharge.  Patient was discharged back to the nursing care facility to continue treatment there and follow-up with her  primary care doctor as an outpatient. [LT]    ED Course User Index [LT] Rock Dannielle Birmingham, MD    Medical Decision Making   External records were reviewed: Reviewed note from neurology office visit on 04/29/2024 which showed patient was seen for tremor and seizures. _________________________  Avelina DELENA Clubs is a 73 y.o. female  who presents to the ED from group home with complaints of unwitnessed fall. Patient states the top of her head hurts. Facility staff suspected patient fell while trying to go to the bathroom. Patient was placed in c-collar en route to this ED. Patient does have noted history of multiple falls. . On my initial evaluation, patient had mild tenderness on the mid occipital scalp and a right frontal scalp.  The following differentials were considered: SDH, SAH, epidural hematoma, laceration, intracerebral hematoma, contusion, bruising.   Pertinent studies were obtained, with results listed in chart above.  results interpreted as above.  Based on ED workup, findings are consistent with fall, closed head injury, history of hydrocephalus.    Patient received no medications in the ER.  On reevaluation, symptoms are improved.  Clinical Assessment/Plan: Patient presented to the ER after unwitnessed fall where she had hit the top of her head in the front of her forehead.  Patient was brought in for CT and CT of her cervical spine for further evaluation of any intracranial traumatic injuries.  Both the CT of the head and CT cervical spine were negative for any acute findings and we are going to have patient follow-up with primary care doctor as an outpatient.  Discussion of management or test interpretation with external provider(s): No external providers needed   Clinical Complexity/Risk   Patient's presentation is most consistent with acute complicated illness / injury requiring diagnostic workup.  Patient's impaired access to primary care increases the complexity of  managing their  presentation with fall.    Pt presentation is complicated by their history of seizures, memory loss, moderate intellectual disabilities, and headaches, resulting in increased risk of fall  Provider time spent in patient care today, inclusive of but not limited to clinical reassessment, review of diagnostic studies, and discharge preparation, was greater than 30 minutes.  OTC medications: yes, Tylenol  as needed Prescription medications discussed: no, none  ED Clinical Impression   Diagnoses that have been ruled out:  None  Diagnoses that are still under consideration:  None  Final diagnoses:  Fall, initial encounter  Closed head injury, initial encounter  History of hydrocephalus    ED Assessment/Plan   ED Disposition     ED  Disposition  Discharge   Condition  Stable   Comment  --         DISCHARGE MEDICATIONS   Medication List     ASK your doctor about these medications    acetaminophen  650 mg ER tablet Commonly known as: TYLENOL  Take 1 tablet (650 mg total) by mouth 2 (two) times a day.   aspirin  81 mg EC tablet Take 81 mg by mouth Once Daily.   celecoxib 200 mg capsule Commonly known as: CeleBREX Take 200 mg by mouth as needed for moderate pain (4-6).   cetirizine 5 mg tablet Commonly known as: ZyrTEC Take 5 mg by mouth nightly.   chlorhexidine 0.12 % solution Commonly known as: PERIDEX Use 0.12 mL in the mouth or throat nightly.   citalopram 20 mg tablet Commonly known as: CeleXA Take 20 mg by mouth Once Daily.   divalproex 500 mg extended release tablet Commonly known as: DEPAKOTE Take 500 mg by mouth 2 (two) times a day.   docusate sodium 100 mg capsule Commonly known as: COLACE Take 100 mg by mouth Once Daily.   donepeziL 10 mg tablet Commonly known as: ARICEPT Take 10 mg by mouth nightly for 30 days.   ergocalciferol 1,250 mcg (50,000 unit) capsule Commonly known as: VITAMIN D2 Take 50,000 Units by mouth once a  week.   famotidine 20 mg tablet Commonly known as: PEPCID Take 20 mg by mouth Once Daily.   heating pads Pads Use up to an hour as needed for back/hip pain   LORazepam 0.5 mg tablet Commonly known as: ATIVAN Take 0.5 mg by mouth every 6 (six) hours as needed for anxiety.   memantine 28 mg Cspx Commonly known as: NAMENDA XR Take 28 mg by mouth Once Daily.   midazolam 5 mg/spray (0.1 mL) Spry Spray into 1 nostril for convulsive seizure lasting longer than 2 minutes   omeprazole 40 mg DR capsule Commonly known as: PriLOSEC Take 40 mg by mouth Once Daily.   ondansetron  8 mg disintegrating tablet Commonly known as: ZOFRAN -ODT Dissolve 8 mg on tongue every 8 (eight) hours as needed.   polyethylene glycol 17 gram packet Commonly known as: GLYCOLAX Take 17 g by mouth Once Daily.   pravastatin 40 mg tablet Commonly known as: PRAVACHOL Take 40 mg by mouth nightly.   primidone 50 mg tablet Commonly known as: MYSOLINE Take 1/2 pill bid for 1 week then increase to 1 pill bid for tremor        FOLLOW UP Warren Chambers, FNP [address] [place] [address] [phone]  In 1 week For Recheck   ____________________________ Scribe's Attestation: This document serves as a record of services personally performed by Rock Birmingham, MD. It was created on their behalf by Lavanda Pang, Scribe, a trained medical scribe. The creation of this record is the provider's dictation and/or activities during the visit.   Electronically signed by: Rock Dannielle Birmingham, MD 06/19/2024 1:41 PM

## 2024-06-20 NOTE — ED Provider Notes (Signed)
 Chief Complaint  Patient presents with  . Fall       HPI 73 year old female significant past medical history of memory loss, communicating hydrocephalus, moderate intellectual disability brought in by EMS from a group home after a fall today right elbow pain.  This is 1 of multiple falls the patient has had over the last several weeks and was here yesterday for a fall as well.  Right now she is complaining of pain to the right arm.  There is a bruise to this arm.  Patient is denying any other complaints at this time.  On triage note it was noted that the patient was complaining of left leg pain but she is not complaining of any pain to the leg at this time.    Patient History Medical History[1] Surgical History[2] Family History[3] Social History[4]    Review of Systems Review of Systems    Physical Exam ED Triage Vitals  Temp 06/20/24 1850 97.7 F (36.5 C)  Heart Rate 06/20/24 1845 59  Resp 06/20/24 1845 19  BP 06/20/24 1845 (!) 140/58  MAP (mmHg) 06/20/24 1850 82  SpO2 06/20/24 1845 97 %  O2 Device 06/20/24 1850 None (Room air)  O2 Flow Rate (L/min) --   Weight --    Physical Exam Constitutional:      General: She is not in acute distress.    Appearance: Normal appearance. She is not ill-appearing.  HENT:     Head: Normocephalic and atraumatic.     Right Ear: External ear normal.     Left Ear: External ear normal.     Nose: Nose normal.     Mouth/Throat:     Mouth: Mucous membranes are moist.     Pharynx: Oropharynx is clear.  Eyes:     Conjunctiva/sclera: Conjunctivae normal.  Neck:     Comments: C-collar in place. Cardiovascular:     Rate and Rhythm: Normal rate.     Pulses: Normal pulses.  Pulmonary:     Effort: Pulmonary effort is normal.     Breath sounds: Normal breath sounds.  Abdominal:     General: Abdomen is flat. There is no distension.     Palpations: Abdomen is soft.     Tenderness: There is no abdominal tenderness.   Musculoskeletal:        General: Signs of injury present.     Cervical back: Neck supple.     Right lower leg: No edema.     Left lower leg: No edema.     Comments: No pain, step-off or deformity palpation of the midline cervical, thoracic or lumbar spine.  Patient has 5/5 strength laterally with handgrip, elbow flexion, extension, shoulder flexion, extension.  Patient has 5 out of 5 strength bilaterally with hip flexion, extension, knee flexion, extension, plantarflexion and dorsiflexion.  Bruising to the right elbow.  No deformity.  Patient still has full range of motion of the right elbow.  Radial pulses 2+.  Capillary refill less than 2 seconds in the fingers.   Skin:    General: Skin is warm and dry.  Neurological:     Mental Status: She is alert. Mental status is at baseline.     Comments: Patient is alert, speaking in full sentences and knows her name.  She is able to tell me she fell and that her right elbow hurts but otherwise is disoriented and at her baseline.        CHA2DS2-VASc  Score: N/A  Glasgow Coma Scale Score: 15                  Procedures                       ED Course & MDM   Medical Decision Making Problems Addressed: Fall, initial encounter: complicated acute illness or injury  Amount and/or Complexity of Data Reviewed Radiology: ordered.  Risk OTC drugs.  73 year old female significant past medical history of memory loss, communicating hydrocephalus, moderate intellectual disability brought in by EMS from a group home after a fall today right elbow pain.   I do not feel that any imaging is needed at this time.  I did obtain a CT of the head, cervical spine and x-ray of the right elbow for further evaluation.  X-ray returns showing no acute fracture of the elbow.  CT of the head shows no acute intracranial hemorrhage or large vessel tentorial infarct, CT of the C-spine shows no evidence of acute fracture or malalignment.  Nursing staff ambulated  the patient to the bathroom with minimal assistance.  Based off of this I do feel the patient is safe for discharge.  Does not necessitate admission.  Social work has already been involved since the patient has had several recent falls and the group home is concerned about the patient's safety living there.  They are working on finding placement for the patient at a higher level of care.  I do feel this is the appropriate plan of care and the patient is still safe for discharge back to the group home at this time.    ED Disposition:  Discharge Final diagnoses:  Fall, initial encounter  Hematoma of elbow    ED Prescriptions   None            [1] Past Medical History: Diagnosis Date  . Communicating hydrocephalus    (CMD)   . Difficulty in walking   . Headache, tension-type   . Memory loss   . Moderate intellectual disabilities   [2] Past Surgical History: Procedure Laterality Date  . CSF SHUNT     Procedure: CSF SHUNT  . LAPAROSCOPIC CHOLECYSTECTOMY W/ CHOLANGIOGRAPHY N/A 07/14/2020   Procedure: CHOLECYSTECTOMY LAPAROSCOPIC W/ CHOLANGIOGRAM;  Surgeon: Prentice Tanda Pizza, MD;  Location: [facility] MAIN OR;  Service: General;  Laterality: N/A;  . VENTRICULOPERITONEAL SHUNT     Procedure: VENTRICULOPERITONEAL SHUNT  [3] No family history on file. [4] Social History Tobacco Use  . Smoking status: Never  . Smokeless tobacco: Never  Substance Use Topics  . Alcohol use: No  . Drug use: No

## 2024-07-12 NOTE — ED Provider Notes (Signed)
 Chief Complaint  Patient presents with  . Ankle Pain    Pt states left ankle pain, ongoing issue  . Behavioral Problem    Per EMS, pt sent for [facility] admission following facility talking with 4th floor. Pt denies BH complaints, pleasant and cooperative at this time    Angela Caldwell is a 73 y.o. female with a past medical history of memory impairment, tremors, hydrocephalus who presents with EMS from group home, [facility] for behavioral concerns. [name], the patient's guardian and sister provided history.  [name] said that the facility has been trying to state that patient needs a higher level of care including skilled nursing instead of just independent living.  [name] state they have been having issues with other residents at the group home.  Therefore, group home is requesting the patient come to the ED for further evaluation and worsening behavioral health, and to consider different placement. Patient has some left ankle pain, but denies direct falls or injuries. Otherwise, no other complaints.   Review of Systems  Constitutional:  Negative for chills and fever.  HENT:  Negative for ear pain and sore throat.   Eyes:  Negative for pain and visual disturbance.  Respiratory:  Negative for cough and shortness of breath.   Cardiovascular:  Negative for chest pain and palpitations.  Gastrointestinal:  Negative for abdominal pain and vomiting.  Genitourinary:  Negative for dysuria and hematuria.  Musculoskeletal:  Negative for arthralgias and back pain.       Left ankle pain  Skin:  Negative for color change and rash.  Neurological:  Negative for seizures and syncope.  All other systems reviewed and are negative.   Past Medical History:  Diagnosis Date  . Hydrocephalus (*)   . Memory impairment   . Seizure (*)   . Tremor    No past surgical history on file. No family history on file.   Social History   Tobacco Use  . Smoking status: Not on file  . Smokeless tobacco: Not on file   Substance Use Topics  . Alcohol use: Not on file   Patient's Medications  New Prescriptions   No medications on file  Previous Medications   CETIRIZINE (ZYRTEC) 10 MG TABLET    Take 10 mg by mouth.   CITALOPRAM HYDROBROMIDE (CELEXA) 20 MG TABLET    Take 20 mg by mouth.   DIVALPROEX SODIUM (DEPAKOTE ER) 250 MG 24 HR TABLET    Take 250 mg by mouth daily.   DONEPEZIL HCL (ARICEPT) 10 MG TABLET    Take 10 mg by mouth daily.   ERGOCALCIFEROL (ERGOCALCIFEROL) 1.25 MG (50000 UT) CAPSULE    Take by mouth.   MEMANTINE HCL (NAMENDA XR) 28 MG 24 HR CAPSULE    Take 28 mg by mouth daily.   PRAVASTATIN SODIUM (PRAVACHOL) 40 MG TABLET       SOLIFENACIN SUCCINATE (VESICARE) 10 MG TABLET    Take 10 mg by mouth.  Modified Medications   No medications on file  Discontinued Medications   No medications on file   Allergies[1]  PHYSICAL EXAM: ED Triage Vitals [07/12/24 1721]  Encounter Vitals Group     BP (!) 139/101     Girls Systolic BP Percentile      Girls Diastolic BP Percentile      Boys Systolic BP Percentile      Boys Diastolic BP Percentile      Heart Rate 69     Resp 18     Temp  97.6 F (36.4 C)     Temp src Oral     SpO2 99 %     Weight 59 kg (130 lb)     Height      Head Circumference      Peak Flow      Pain Score Five     Pain Loc      Pain Education      Exclude from Growth Chart    Physical Exam Vitals and nursing note reviewed.  Constitutional:      General: She is not in acute distress.    Appearance: She is well-developed. She is not ill-appearing or toxic-appearing.  HENT:     Head: Normocephalic and atraumatic.     Mouth/Throat:     Mouth: Mucous membranes are moist.     Pharynx: Oropharynx is clear.  Eyes:     Extraocular Movements: Extraocular movements intact.     Conjunctiva/sclera: Conjunctivae normal.     Pupils: Pupils are equal, round, and reactive to light.  Cardiovascular:     Rate and Rhythm: Normal rate and regular rhythm.     Heart sounds: No  murmur heard. Pulmonary:     Effort: Pulmonary effort is normal. No respiratory distress.     Breath sounds: Normal breath sounds.  Abdominal:     General: Bowel sounds are normal. There is no distension.     Palpations: Abdomen is soft.     Tenderness: There is no abdominal tenderness.  Musculoskeletal:     Cervical back: Neck supple.     Comments: Mild tenderness to left ankle, no erythema, edema, ecchymosis, deformity or open wounds noted  Skin:    General: Skin is warm and dry.     Capillary Refill: Capillary refill takes less than 2 seconds.  Neurological:     Mental Status: She is alert. Mental status is at baseline.  Psychiatric:        Mood and Affect: Mood normal.     DIAGNOSTICS:  Pulse Ox: Pulse oximetry 99% on RA indicating adequate oxygenation.  Imaging: XR Ankle Min 3 Views Left  Final Result  IMPRESSION:   - No acute fractures.   - Joint spaces are preserved.   - Osteopenia.  - No joint effusions.   - Misc: NA     Interpretation provided by a fellowship-trained musculoskeletal radiologist.              Electronically Signed by: Lynwood Murders, DO on 07/12/2024 6:07 PM     Labs: Labs Reviewed  COMPREHENSIVE METABOLIC PANEL - Abnormal; Notable for the following components:      Result Value   Na 132 (*)    Glucose 162 (*)    All other components within normal limits  URINALYSIS W/MICRO REFLEX CULTURE - SYMPTOMATIC - Abnormal; Notable for the following components:   Urine Blood 0.1 (*)    Urine Squamous Epithelial Cells 3-4 (*)    Urine WBC 3-5 (*)    Urine Mucous 1+ (*)    UA Microscopic Yes Micro (*)    All other components within normal limits   Narrative:    Does not meet criteria for reflex to Urine Culture.  URINE DRUGS OF ABUSE SCRN - Abnormal; Notable for the following components:   Barb Scr Positive (*)    All other components within normal limits   Narrative:    Please Note Detection Levels Below:  Amphetamines                    1000 ng/mL  Barbiturates                    200 ng/mL  Benzodiazepines                 200 ng/mL  Cannabinoids (Marijuana, THC)   50 ng/mL  Cocaine                         300 ng/mL  Opiates                         300 ng/mL  Methadone                       300 ng/mL  Oxycodone                       100 ng/mL  Fentanyl                          5 ng/mL  Buprenorphine                     5 ng/mL  This test is a screening test and results are only to be used for medical purposes.  If confirmation of positive results are needed, please order confirmation by GC/MS for each drug that needs confirmation.  Urine specimens are retained for 5 days.   SALICYLATE LEVEL - Abnormal; Notable for the following components:   Salicylate <3.0 (*)    All other components within normal limits  ACETAMINOPHEN  LEVEL - Abnormal; Notable for the following components:   Acetaminophen  <5.0 (*)    All other components within normal limits  MAGNESIUM - Normal  ETHANOL - Normal  CBC AND DIFFERENTIAL    ED COURSE AND TREATMENT:  Patient's condition remained stable during Emergency Department evaluation.   Previous medical records requested. 06/20/24 for hematoma  Orders written.  Diagnostics reviewed.   Medications  acetaminophen  (TYLENOL ) tablet 650 mg (has no administration in time range)  aspirin  (ECOTRIN LOW DOSE) EC tablet 81 mg (has no administration in time range)  cetirizine (ZYRTEC) tablet 10 mg (has no administration in time range)  citalopram hydrobromide (CELEXA) tablet 20 mg (has no administration in time range)  divalproex sodium (DEPAKOTE ER) 24 hr tablet 500 mg (has no administration in time range)  donepezil HCl (ARICEPT) tablet 10 mg (has no administration in time range)  famotidine (PEPCID) tablet 20 mg (has no administration in time range)  memantine HCl (NAMENDA) tablet 20 mg (has no administration in time range)  pantoprazole sodium  (PROTONIX) EC tablet 40 mg (has no administration in time range)  ondansetron  (ZOFRAN -ODT) disintegrating tablet 4 mg (has no administration in time range)  pravastatin sodium (PRAVACHOL) tablet 40 mg (has no administration in time range)  primidone (MYSOLINE) tablet 50 mg (has no administration in time range)    BP (!) 139/101   Pulse 69   Temp 97.6 F (36.4 C) (Oral)   Resp 18   Wt 59 kg (130 lb)   SpO2 99%   ED Course as of 07/12/24 1826  [facility] Documentation  Sun Jul 12, 2024  1736 [name] [phone] is the patient's sister and guardian. States she is on her way to  the emergency department.    CLINICAL IMPRESSION: 1. Behavior concern   2. Acute left ankle pain       CONDITION ON DISCHARGE:  Stable  PLAN AND FOLLOW-UP:   The patient is currently medically stable, and appropriate for behavioral health evaluation. 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 Patient would benefit from psychiatric evaluation to lend expertise and help determine the best disposition and treatment plan.  To ensure ongoing care as patient awaits BH evaluation, the following measures have been taken: -All acute medical issues identified on ED evaluation have been addressed  -Behavioral Health ED holding orders have been placed (including CIWA/COWS orders as needed) -Home Medications will be addressed through medication reconciliation process -Dietary orders, including blood sugar control, have been placed.  PRESCRIPTIONS:    Medication List     ASK your doctor about these medications    cetirizine 10 mg tablet Commonly known as: ZYRTEC   citalopram hydrobromide 20 mg tablet Commonly known as: CELEXA   divalproex sodium 250 mg 24 hr tablet Commonly known as: DEPAKOTE ER   donepezil HCl 10 mg tablet Commonly known as: ARICEPT   ergocalciferol 50,000 units Caps capsule Commonly known as: Vitamin  D2   memantine HCl 28 mg 24 hr capsule Commonly known as: NAMENDA XR   pravastatin sodium 40 mg tablet Commonly known as: PRAVACHOL   solifenacin succinate 10 mg tablet Commonly known as: VESICARE        MEDICAL DECISION MAKING:: MDM Number of Diagnoses or Management Options Acute left ankle pain Behavior concern Diagnosis management comments: A 73 year old female with a history of memory issue, tremors presents via EMS due to wanting behavioral health assessment and likely placement to skilled nursing instead of independent living at [facility]. The patient is pleasant at this time, only complaints of left ankle pain. No signs of obvious trauma or infection.  X-ray of left ankle on my review and per radiologist shows no acute fractures or dislocation.  Will have behavioral health and case management consult to provide final disposition.    Amount and/or Complexity of Data Reviewed Clinical lab tests: ordered and reviewed Tests in the radiology section of CPT: ordered and reviewed Obtain history from someone other than the patient: yes Review and summarize past medical records: yes Independent visualization of images, tracings, or specimens: yes  Patient Progress Patient progress: stable    The patient was seen, evaluated and managed by ED attending physician, Ludie Primmer, D.O.       [1] No Known Allergies  Ludie Primmer, DO 07/12/24 1826

## 2024-07-13 NOTE — ED Provider Notes (Signed)
 Patient seen and evaluated on morning rounds.  Remains hemodynamically and vitally stable.  Per RN, no acute over night events. The patient is sleeping comfortably.    Physical Exam: BP (!) 155/72 (BP Location: Left Upper Arm, Patient Position: Lying)   Pulse 71   Temp 97.8 F (36.6 C) (Oral)   Resp 18   Wt 59 kg (130 lb)   SpO2 99%   Sleeping comfortably, equal chest rises, in no acute distress.   Plan/Disposition:  - Per psychiatry, they will only follow loosely, recommends case management to assist with living situation. - Await case management consultation.   Ludie Primmer, DO 07/13/24 [phone]

## 2024-07-14 NOTE — ED Provider Notes (Signed)
 [facility]  ED EXTENDED STAY PROGRESS NOTE   Date & Time of assessment: 07/14/2024  10:27 AM  Current Vital Signs: Vitals:   07/14/24 0542  BP: 138/72  Pulse: 53  Resp: 18  Temp: 97.7 F (36.5 C)  SpO2: 98%    ED Medications: Medications  acetaminophen  (TYLENOL ) tablet 650 mg (650 mg Oral Given 07/14/24 0853)  aspirin  (ECOTRIN LOW DOSE) EC tablet 81 mg (81 mg Oral Given 07/14/24 0853)  cetirizine (ZYRTEC) tablet 10 mg (10 mg Oral Given 07/14/24 0853)  citalopram hydrobromide (CELEXA) tablet 20 mg (20 mg Oral Given 07/14/24 0852)  divalproex sodium (DEPAKOTE ER) 24 hr tablet 500 mg (500 mg Oral Given 07/14/24 0853)  famotidine (PEPCID) tablet 20 mg (20 mg Oral Given 07/14/24 0852)  memantine HCl (NAMENDA) tablet 20 mg (20 mg Oral Given 07/14/24 0852)  pantoprazole sodium (PROTONIX) EC tablet 40 mg (40 mg Oral Given 07/14/24 0852)  ondansetron  (ZOFRAN -ODT) disintegrating tablet 4 mg (has no administration in time range)  pravastatin sodium (PRAVACHOL) tablet 40 mg (40 mg Oral Given 07/13/24 2028)  primidone (MYSOLINE) tablet 50 mg (50 mg Oral Given 07/14/24 1006)  donepezil HCl (ARICEPT) tablet 10 mg (10 mg Oral Given 07/14/24 0852)  OLANZapine (ZYPREXA) injection 2.5 mg (has no administration in time range)  OLANZapine (ZYPREXA) tablet 2.5 mg (2.5 mg Oral Given 07/14/24 0621)  diphenhydrAMINE (BANOPHEN,BENADRYL) capsule 25 mg (has no administration in time range)    Or  diphenhydrAMINE (BENADRYL) injection 25 mg (has no administration in time range)  melatonin tablet 3 mg (3 mg Oral Given 07/13/24 2029)    Interval Assessment: Remains medically stable, without complaint.  Patient did not sleep much last night, currently is resting.   Plan: Patient being discharged back to her group home today per case management.  Final diagnoses:  Behavior concern  Acute left ankle pain     Electronically signed by:   Sydni Alyssa Mazer,  PA-C 07/14/24 1028

## 2024-08-06 ENCOUNTER — Other Ambulatory Visit: Payer: Self-pay

## 2024-08-06 ENCOUNTER — Emergency Department (HOSPITAL_COMMUNITY)

## 2024-08-06 ENCOUNTER — Encounter (HOSPITAL_COMMUNITY): Payer: Self-pay | Admitting: Emergency Medicine

## 2024-08-06 ENCOUNTER — Emergency Department (HOSPITAL_COMMUNITY)
Admission: EM | Admit: 2024-08-06 | Discharge: 2024-08-06 | Disposition: A | Discharge status: Home / Self Care | Attending: Emergency Medicine | Admitting: Emergency Medicine

## 2024-08-06 DIAGNOSIS — W19XXXA Unspecified fall, initial encounter: Secondary | ICD-10-CM

## 2024-08-06 DIAGNOSIS — F0390 Unspecified dementia without behavioral disturbance: Secondary | ICD-10-CM | POA: Diagnosis not present

## 2024-08-06 DIAGNOSIS — J209 Acute bronchitis, unspecified: Secondary | ICD-10-CM | POA: Diagnosis not present

## 2024-08-06 DIAGNOSIS — M25552 Pain in left hip: Secondary | ICD-10-CM | POA: Diagnosis not present

## 2024-08-06 DIAGNOSIS — R059 Cough, unspecified: Secondary | ICD-10-CM | POA: Diagnosis present

## 2024-08-06 DIAGNOSIS — W06XXXA Fall from bed, initial encounter: Secondary | ICD-10-CM | POA: Diagnosis not present

## 2024-08-06 DIAGNOSIS — J40 Bronchitis, not specified as acute or chronic: Secondary | ICD-10-CM

## 2024-08-06 LAB — CBC WITH DIFFERENTIAL/PLATELET
Abs Immature Granulocytes: 0.07 K/uL (ref 0.00–0.07)
Basophils Absolute: 0.1 K/uL (ref 0.0–0.1)
Basophils Relative: 1 %
Eosinophils Absolute: 0.2 K/uL (ref 0.0–0.5)
Eosinophils Relative: 2 %
HCT: 43.2 % (ref 36.0–46.0)
Hemoglobin: 14.3 g/dL (ref 12.0–15.0)
Immature Granulocytes: 1 %
Lymphocytes Relative: 14 %
Lymphs Abs: 1.6 K/uL (ref 0.7–4.0)
MCH: 30.2 pg (ref 26.0–34.0)
MCHC: 33.1 g/dL (ref 30.0–36.0)
MCV: 91.1 fL (ref 80.0–100.0)
Monocytes Absolute: 0.8 K/uL (ref 0.1–1.0)
Monocytes Relative: 8 %
Neutro Abs: 8 K/uL — ABNORMAL HIGH (ref 1.7–7.7)
Neutrophils Relative %: 74 %
Platelets: 254 K/uL (ref 150–400)
RBC: 4.74 MIL/uL (ref 3.87–5.11)
RDW: 12.8 % (ref 11.5–15.5)
WBC: 10.8 K/uL — ABNORMAL HIGH (ref 4.0–10.5)
nRBC: 0 % (ref 0.0–0.2)

## 2024-08-06 LAB — URINALYSIS, ROUTINE W REFLEX MICROSCOPIC
Bilirubin Urine: NEGATIVE
Glucose, UA: NEGATIVE mg/dL
Ketones, ur: NEGATIVE mg/dL
Nitrite: NEGATIVE
Protein, ur: NEGATIVE mg/dL
Specific Gravity, Urine: 1.008 (ref 1.005–1.030)
pH: 9 — ABNORMAL HIGH (ref 5.0–8.0)

## 2024-08-06 LAB — RESP PANEL BY RT-PCR (RSV, FLU A&B, COVID)  RVPGX2
Influenza A by PCR: NEGATIVE
Influenza B by PCR: NEGATIVE
Resp Syncytial Virus by PCR: NEGATIVE
SARS Coronavirus 2 by RT PCR: NEGATIVE

## 2024-08-06 LAB — BASIC METABOLIC PANEL WITH GFR
Anion gap: 6 (ref 5–15)
BUN: 6 mg/dL — ABNORMAL LOW (ref 8–23)
CO2: 30 mmol/L (ref 22–32)
Calcium: 9.2 mg/dL (ref 8.9–10.3)
Chloride: 98 mmol/L (ref 98–111)
Creatinine, Ser: 0.74 mg/dL (ref 0.44–1.00)
GFR, Estimated: >60 mL/min (ref >60)
Glucose, Bld: 93 mg/dL (ref 70–99)
Potassium: 4.6 mmol/L (ref 3.5–5.1)
Sodium: 134 mmol/L — ABNORMAL LOW (ref 135–145)

## 2024-08-06 MED ORDER — IPRATROPIUM-ALBUTEROL 0.5-2.5 (3) MG/3ML IN SOLN
3.0000 mL | Freq: Once | RESPIRATORY_TRACT | Status: AC
  Administered 2024-08-06: 3 mL via RESPIRATORY_TRACT
  Filled 2024-08-06: qty 3

## 2024-08-06 MED ORDER — ALBUTEROL SULFATE HFA 108 (90 BASE) MCG/ACT IN AERS: 2.0000 | INHALATION_SPRAY | RESPIRATORY_TRACT | 0 refills | Status: AC | PRN

## 2024-08-06 MED ORDER — ACETAMINOPHEN 325 MG PO TABS
650.0000 mg | ORAL_TABLET | Freq: Once | ORAL | Status: AC
  Administered 2024-08-06: 650 mg via ORAL
  Filled 2024-08-06: qty 2

## 2024-08-06 MED ORDER — PREDNISONE 20 MG PO TABS: 40.0000 mg | ORAL_TABLET | Freq: Every day | ORAL | 0 refills | Status: AC

## 2024-08-06 NOTE — Medical Student Note (Incomplete)
   [facility]-EMERGENCY DEPT Provider Student Note For educational purposes for Medical, PA and NP students only and not part of the legal medical record.   CSN: [id number] Arrival date & time: 08/06/24  9378      History   Chief Complaint Chief Complaint  Patient presents with  . Fall    HPI Angela Caldwell is a 73 y.o. female.  73 year old female with a PMHx of seizure, intellectual disability, hydrocephalus presents to the ED via EMS after sustaining a fall at home. History limited due to patients intellectual disability. EMS reports that the patient got out of bed this morning, got tangled in a blanket and then fell, denying LOC or chronic anticoagulant use. Patient reportedly hit the back of her head and her L thigh.    Pt arrived via [facility] from [facility] after having a fall. Pt got out of bed, got tangled in blanket and fell. No LOC, no thinners. Hx of dementia. EMS reports this is pt's baseline. Pt did hit the back of her head. Pt also has a cough that facility reports started yesterday.    Fall    Past Medical History:  Diagnosis Date  . Intellectual disability   . Seizure [facility])     Patient Active Problem List   Diagnosis Date Noted  . Pulmonary nodule, left 07/10/2020  . 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 Ambulatory dysfunction 03/24/2014  . Memory loss 03/24/2014  . 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 No pertinent surgical history.  OB History   No obstetric history on file.      Home Medications    Prior to Admission medications   Not on File    Family History History reviewed. No pertinent family history.  Social History Social History   Tobacco Use  . Smoking status: Never  . Smokeless tobacco: Never  Substance Use Topics  . Alcohol use: Never  . Drug use: Never     Allergies   Patient has no known allergies.   Review of Systems Review of Systems   Physical Exam Updated Vital  Signs BP (!) 140/83 (BP Location: Right Arm)   Pulse 71   Temp 98 F (36.7 C) (Oral)   Resp (!) 22   SpO2 100%   Physical Exam   ED Treatments / Results  Labs (all labs ordered are listed, but only abnormal results are displayed) Labs Reviewed  RESP PANEL BY RT-PCR (RSV, FLU A&B, COVID)  [id number]  BASIC METABOLIC PANEL WITH GFR  CBC WITH DIFFERENTIAL/PLATELET  URINALYSIS, ROUTINE W REFLEX MICROSCOPIC    EKG  Radiology No results found.  Procedures Procedures (including critical care time)  Medications Ordered in ED Medications  acetaminophen  (TYLENOL ) tablet 650 mg (has no administration in time range)  ipratropium-albuterol (DUONEB) 0.5-2.5 (3) MG/3ML nebulizer solution 3 mL (has no administration in time range)     Initial Impression / Assessment and Plan / ED Course  I have reviewed the triage vital signs and the nursing notes.  Pertinent labs & imaging results that were available during my care of the patient were reviewed by me and considered in my medical decision making (see chart for details).     ***  Final Clinical Impressions(s) / ED Diagnoses   Final diagnoses:  None    New Prescriptions New Prescriptions   No medications on file

## 2024-08-06 NOTE — ED Notes (Signed)
 Pt transported to Xray via stretcher

## 2024-08-06 NOTE — ED Triage Notes (Addendum)
 Pt arrived via [facility] from [facility] after having a fall. Pt got out of bed, got tangled in blanket and fell. No LOC, no thinners. Hx of dementia. EMS reports this is pt's baseline. Pt did hit the back of her head. Pt also has a cough that facility reports started yesterday.   152/88 BP

## 2024-08-06 NOTE — ED Notes (Signed)
 [facility] contacted

## 2024-08-06 NOTE — Discharge Instructions (Signed)
 Your imaging today was reassuring.  Your chest x-ray did not show any signs of pneumonia and your COVID and flu test were negative.  Suspect bronchitis.  Use prescribed albuterol inhaler and steroid to help with cough and wheezing.  Follow-up closely with your regular doctor.

## 2024-08-06 NOTE — ED Provider Notes (Signed)
 [place] EMERGENCY DEPARTMENT AT [facility] Provider Note   CSN: [id number] Arrival date & time: 08/06/24  9378     Patient presents with: Angela Caldwell is a 73 y.o. female.   Angela Caldwell is a 73 y.o. female with a history of intellectual disability, dementia and seizure, who presents to the ED via EM from [facility] for evaluation after a fall. At baseline pt can answer some question and deos not walk, perfamily at bedside and EMS patient is at baseline mental status. Facility reported she tried to get up from bed and fell. Pt states she thinks she fell on her left hip. Does not remember of she hit her head. Denies pain aside from the left hip. Facility also reports frequent coughing since yesterday, no know fevers. Pt unable to contribute much to history.   Fall       Prior to Admission medications   Not on File    Allergies: Patient has no known allergies.    Review of Systems  Unable to perform ROS: Dementia    Updated Vital Signs BP (!) 140/83 (BP Location: Right Arm)   Pulse 71   Temp 98 F (36.7 C) (Oral)   Resp (!) 22   SpO2 100%   Physical Exam Vitals and nursing note reviewed.  Constitutional:      General: She is not in acute distress.    Appearance: Normal appearance. She is well-developed. She is not diaphoretic.     Comments: Alert, and in no acute distress, at baseline mental status  HENT:     Head: Normocephalic and atraumatic.  Eyes:     General:        Right eye: No discharge.        Left eye: No discharge.     Pupils: Pupils are equal, round, and reactive to light.  Cardiovascular:     Rate and Rhythm: Normal rate and regular rhythm.     Pulses: Normal pulses.     Heart sounds: Normal heart sounds.  Pulmonary:     Effort: Pulmonary effort is normal. No respiratory distress.     Breath sounds: Normal breath sounds. No wheezing or rales.     Comments: Respirations equal and unlabored, patient able to speak in  full sentences, lungs clear to auscultation bilaterally  Abdominal:     General: Bowel sounds are normal. There is no distension.     Palpations: Abdomen is soft. There is no mass.     Tenderness: There is no abdominal tenderness. There is no guarding.     Comments: Abdomen soft, nondistended, nontender to palpation in all quadrants without guarding or peritoneal signs  Musculoskeletal:        General: Tenderness present. No deformity.     Cervical back: Neck supple.     Comments: Mild tenderness over the left lateral hip without palpable deformity or overlying erythema or ecchymosis.  No bony tenderness at the knee.  No midline spinal tenderness.  All other joints supple and easily movable in all compartments soft  Skin:    General: Skin is warm and dry.     Capillary Refill: Capillary refill takes less than 2 seconds.  Neurological:     Mental Status: She is alert and oriented to person, place, and time.     Coordination: Coordination normal.     Comments: Able to follow commands CN III-XII intact Normal strength in upper and lower extremities bilaterally including dorsiflexion and  plantar flexion, strong and equal grip strength Sensation normal to light and sharp touch Moves extremities without ataxia  Psychiatric:        Mood and Affect: Mood normal.        Behavior: Behavior normal.     (all labs ordered are listed, but only abnormal results are displayed) Labs Reviewed  BASIC METABOLIC PANEL WITH GFR - Abnormal; Notable for the following components:      Result Value   Sodium 134 (*)    BUN 6 (*)    All other components within normal limits  CBC WITH DIFFERENTIAL/PLATELET - Abnormal; Notable for the following components:   WBC 10.8 (*)    Neutro Abs 8.0 (*)    All other components within normal limits  URINALYSIS, ROUTINE W REFLEX MICROSCOPIC - Abnormal; Notable for the following components:   APPearance HAZY (*)    pH 9.0 (*)    Hgb urine dipstick MODERATE (*)     Leukocytes,Ua SMALL (*)    Bacteria, UA RARE (*)    All other components within normal limits  RESP PANEL BY RT-PCR (RSV, FLU A&B, COVID)  [id number]    EKG: None  Radiology: CT Head Wo Contrast Result Date: 08/06/2024 EXAM: CT HEAD WITHOUT CONTRAST 08/06/2024 09:32:01 AM TECHNIQUE: CT of the head was performed without the administration of intravenous contrast. Automated exposure control, iterative reconstruction, and/or weight based adjustment of the mA/kV was utilized to reduce the radiation dose to as low as reasonably achievable. COMPARISON: 04/23/2024 CLINICAL HISTORY: Head trauma, minor (Age >= 65y) FINDINGS: BRAIN AND VENTRICLES: 2 shunts remain in place, 1 in the posterior right lateral ventricle and the second at the foramen magnum. Continued ventriculomegaly, stable since prior study. No acute hemorrhage. No evidence of acute infarct. No extra-axial collection. No mass effect or midline shift. ORBITS: No acute abnormality. SINUSES: No acute abnormality. SOFT TISSUES AND SKULL: No acute soft tissue abnormality. No skull fracture. IMPRESSION: 1. No acute intracranial abnormality. 2. Stable ventriculomegaly. Electronically signed by: Franky Crease MD 08/06/2024 09:58 AM EST RP Workstation: [id number]   CT Cervical Spine Wo Contrast Result Date: 08/06/2024 CLINICAL DATA:  Neck trauma. EXAM: CT CERVICAL SPINE WITHOUT CONTRAST TECHNIQUE: Multidetector CT imaging of the cervical spine was performed without intravenous contrast. Multiplanar CT image reconstructions were also generated. RADIATION DOSE REDUCTION: This exam was performed according to the departmental dose-optimization program which includes automated exposure control, adjustment of the mA and/or kV according to patient size and/or use of iterative reconstruction technique. COMPARISON:  CT cervical spine 02/11/2023. FINDINGS: Technical note: Despite efforts by the technologist and patient, moderate motion artifact is present on today's exam  and could not be eliminated. This reduces exam sensitivity and specificity. Alignment: Straightening without focal angulation or listhesis. 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 Allowing for the motion limitations of this examination, there is no evidence of acute fracture or traumatic subluxation. Interfacetal ankylosis is present bilaterally from C3 through C5. Soft tissues and spinal canal: No prevertebral fluid or swelling. No visible canal hematoma. Disc levels: Similar multilevel spondylosis with disc space narrowing and uncinate spurring. No large disc herniation or high-grade spinal stenosis demonstrated. Upper chest: Clear lung apices. Other: Intracranial findings dictated separately. IMPRESSION: 1. This examination is moderately motion degraded. If sufficient clinical concern for acute injury, repeat follow-up study should be considered. 2. No evidence of acute cervical spine fracture, traumatic subluxation or static signs of instability. 3. 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 4. Similar multilevel cervical spondylosis. Electronically Signed   By: Elsie Perone M.D.   On: 08/06/2024 09:52   DG Pelvis 1-2 Views Result Date: 08/06/2024 CLINICAL DATA:  Fall. EXAM: PELVIS - 1-2 VIEW COMPARISON:  02/11/2023 FINDINGS: Minimal symmetric osteoarthritis of the hips. No acute fracture or dislocation. Degenerative change of the spine and sacroiliac joints. Remainder of the exam is unchanged. IMPRESSION: 1. No acute findings. 2. Minimal symmetric osteoarthritis of the hips. Electronically Signed   By: Toribio Agreste M.D.   On: 08/06/2024 08:13   DG FEMUR MIN 2 VIEWS LEFT Result Date: 08/06/2024 CLINICAL DATA:  Fall. EXAM: LEFT FEMUR 2 VIEWS COMPARISON:  12/23/2020 FINDINGS: There is no evidence of fracture or other focal bone lesions. Soft tissues are unremarkable. IMPRESSION: Negative.  Electronically Signed   By: Toribio Agreste M.D.   On: 08/06/2024 08:11   DG Chest 1 View Result Date: 08/06/2024 CLINICAL DATA:  Fall.  Cough. EXAM: CHEST  1 VIEW COMPARISON:  None Available. FINDINGS: Lungs are hypoinflated without focal airspace consolidation, effusion or pneumothorax. Cardiomediastinal silhouette is normal. Degenerative change of the spine. No acute fracture. IMPRESSION: Hypoinflation without acute cardiopulmonary disease. Electronically Signed   By: Toribio Agreste M.D.   On: 08/06/2024 08:11      Procedures   Medications Ordered in the ED  acetaminophen  (TYLENOL ) tablet 650 mg (650 mg Oral Given 08/06/24 0727)  ipratropium-albuterol  (DUONEB) 0.5-2.5 (3) MG/3ML nebulizer solution 3 mL (3 mLs Nebulization Given 08/06/24 9272)                                    Medical Decision Making Amount and/or Complexity of Data Reviewed Labs: ordered. Radiology: ordered.  Risk OTC drugs. Prescription drug management.   73 year old female presents after a ground-level mechanical fall, alert and at baseline mental status, unsure if she hit her head but complaining of left hip pain.  No obvious trauma or deformity.  Also experiencing a cough since yesterday.  Concern for potential fracture, head injury or soft tissue injury, with cough concern for viral URI, bronchitis or pneumonia.  Labs overall reassuring without leukocytosis and normal hemoglobin, no significant electrolyte derangements, small amount of leukocytes present on UA with only rare bacteria and patient not experiencing urinary symptoms.  Will not treat at this time.  COVID flu and RSV test negative.  Imaging ordered including CT of the head and cervical spine as well as x-rays of the chest pelvis and left femur, all images viewed and interpreted independently, agree with radiologist findings.  Head and C-spine CTs without evidence of acute intracranial bleeding or traumatic injury from fall.  X-rays without evidence of  fracture or dislocation and chest x-ray clear without pneumonia, edema or other acute cardiopulmonary disease.  Patient treated with DuoNeb and Tylenol  and has had improvement in cough.  Will prescribe inhaler and short course of steroids for likely bronchitis.  Discussed plan of care and plan for discharge back to skilled nursing facility with patient's sister via phone and with the sister-in-law at bedside and they are in agreement with this plan.  Nursing staff will arrange transport back to SNF     Final diagnoses:  Fall, initial encounter  Bronchitis    ED Discharge Orders          Ordered    albuterol  (VENTOLIN  HFA) 108 (90 Base) MCG/ACT inhaler  Every 4 hours PRN        08/06/24 1120    predniSONE  (DELTASONE ) 20 MG tablet  Daily        08/06/24 1120               Alva Larraine FALCON, PA-C 08/12/24 2057    Pamella Ozell LABOR, DO 08/19/24 1500

## 2024-08-14 ENCOUNTER — Emergency Department (HOSPITAL_COMMUNITY)

## 2024-08-14 ENCOUNTER — Observation Stay (HOSPITAL_COMMUNITY)
Admission: EM | Admit: 2024-08-14 | Discharge: 2024-08-15 | Disposition: A | Discharge status: Skilled Nursing Facility | Attending: Family Medicine | Admitting: Family Medicine

## 2024-08-14 ENCOUNTER — Other Ambulatory Visit: Payer: Self-pay

## 2024-08-14 DIAGNOSIS — R269 Unspecified abnormalities of gait and mobility: Secondary | ICD-10-CM

## 2024-08-14 DIAGNOSIS — G40909 Epilepsy, unspecified, not intractable, without status epilepticus: Secondary | ICD-10-CM | POA: Insufficient documentation

## 2024-08-14 DIAGNOSIS — Z79899 Other long term (current) drug therapy: Secondary | ICD-10-CM | POA: Insufficient documentation

## 2024-08-14 DIAGNOSIS — I700 Atherosclerosis of aorta: Secondary | ICD-10-CM | POA: Insufficient documentation

## 2024-08-14 DIAGNOSIS — G910 Communicating hydrocephalus: Secondary | ICD-10-CM | POA: Diagnosis present

## 2024-08-14 DIAGNOSIS — F411 Generalized anxiety disorder: Secondary | ICD-10-CM | POA: Insufficient documentation

## 2024-08-14 DIAGNOSIS — G8929 Other chronic pain: Secondary | ICD-10-CM | POA: Insufficient documentation

## 2024-08-14 DIAGNOSIS — Z7982 Long term (current) use of aspirin: Secondary | ICD-10-CM | POA: Insufficient documentation

## 2024-08-14 DIAGNOSIS — R54 Age-related physical debility: Secondary | ICD-10-CM | POA: Insufficient documentation

## 2024-08-14 DIAGNOSIS — F79 Unspecified intellectual disabilities: Secondary | ICD-10-CM

## 2024-08-14 DIAGNOSIS — R29810 Facial weakness: Secondary | ICD-10-CM

## 2024-08-14 DIAGNOSIS — R413 Other amnesia: Secondary | ICD-10-CM | POA: Diagnosis present

## 2024-08-14 LAB — CBC WITH DIFFERENTIAL/PLATELET
Abs Immature Granulocytes: 0.08 K/uL — ABNORMAL HIGH (ref 0.00–0.07)
Basophils Absolute: 0 K/uL (ref 0.0–0.1)
Basophils Relative: 0 %
Eosinophils Absolute: 0 K/uL (ref 0.0–0.5)
Eosinophils Relative: 0 %
HCT: 40 % (ref 36.0–46.0)
Hemoglobin: 13.5 g/dL (ref 12.0–15.0)
Immature Granulocytes: 1 %
Lymphocytes Relative: 12 %
Lymphs Abs: 1 K/uL (ref 0.7–4.0)
MCH: 30.4 pg (ref 26.0–34.0)
MCHC: 33.8 g/dL (ref 30.0–36.0)
MCV: 90.1 fL (ref 80.0–100.0)
Monocytes Absolute: 0.1 K/uL (ref 0.1–1.0)
Monocytes Relative: 2 %
Neutro Abs: 7 K/uL (ref 1.7–7.7)
Neutrophils Relative %: 85 %
Platelets: 300 K/uL (ref 150–400)
RBC: 4.44 MIL/uL (ref 3.87–5.11)
RDW: 12.6 % (ref 11.5–15.5)
WBC: 8.2 K/uL (ref 4.0–10.5)
nRBC: 0 % (ref 0.0–0.2)

## 2024-08-14 LAB — URINALYSIS, ROUTINE W REFLEX MICROSCOPIC
Bacteria, UA: NONE SEEN
Bilirubin Urine: NEGATIVE
Glucose, UA: NEGATIVE mg/dL
Ketones, ur: NEGATIVE mg/dL
Leukocytes,Ua: NEGATIVE
Nitrite: NEGATIVE
Protein, ur: NEGATIVE mg/dL
Specific Gravity, Urine: 1.003 — ABNORMAL LOW (ref 1.005–1.030)
pH: 8 (ref 5.0–8.0)

## 2024-08-14 LAB — COMPREHENSIVE METABOLIC PANEL WITH GFR
ALT: 10 U/L (ref 0–44)
AST: 19 U/L (ref 15–41)
Albumin: 3.1 g/dL — ABNORMAL LOW (ref 3.5–5.0)
Alkaline Phosphatase: 71 U/L (ref 38–126)
Anion gap: 12 (ref 5–15)
BUN: <5 mg/dL — ABNORMAL LOW (ref 8–23)
CO2: 22 mmol/L (ref 22–32)
Calcium: 8.8 mg/dL — ABNORMAL LOW (ref 8.9–10.3)
Chloride: 99 mmol/L (ref 98–111)
Creatinine, Ser: 0.69 mg/dL (ref 0.44–1.00)
GFR, Estimated: >60 mL/min (ref >60)
Glucose, Bld: 135 mg/dL — ABNORMAL HIGH (ref 70–99)
Potassium: 5 mmol/L (ref 3.5–5.1)
Sodium: 133 mmol/L — ABNORMAL LOW (ref 135–145)
Total Bilirubin: 0.9 mg/dL (ref 0.0–1.2)
Total Protein: 6.4 g/dL — ABNORMAL LOW (ref 6.5–8.1)

## 2024-08-14 LAB — APTT: aPTT: 28 s (ref 24–36)

## 2024-08-14 LAB — PROTIME-INR
INR: 0.9 (ref 0.8–1.2)
Prothrombin Time: 13.2 s (ref 11.4–15.2)

## 2024-08-14 LAB — ETHANOL: Alcohol, Ethyl (B): <15 mg/dL (ref <15)

## 2024-08-14 LAB — CBG MONITORING, ED: Glucose-Capillary: 142 mg/dL — ABNORMAL HIGH (ref 70–99)

## 2024-08-14 MED ORDER — LORAZEPAM 1 MG PO TABS: 1.0000 mg | ORAL_TABLET | ORAL | Status: DC | PRN

## 2024-08-14 MED ORDER — ACETAMINOPHEN 325 MG PO TABS
650.0000 mg | ORAL_TABLET | Freq: Once | ORAL | Status: AC
  Administered 2024-08-14: 650 mg via ORAL
  Filled 2024-08-14: qty 2

## 2024-08-14 NOTE — ED Provider Notes (Addendum)
 [place] EMERGENCY DEPARTMENT AT [facility] Provider Note   CSN: [id number] Arrival date & time: 08/14/24  1544     Patient presents with: Headache and Facial Droop   Angela Caldwell is a 73 y.o. female with past medical history of fourth ventricular shunt, intellectual disability, dementia, seizure presents to the Emergency Department via EMS from [facility] for evaluation of right sided facial droop, leaning to right side when ambulating when her sister came to visit her today at 0230.  Staff from [facility] endorsed that she also complained of a headache that started yesterday. At baseline, patient is A&Ox2 as she does not know the current year.  She was evaluated on 08/06/2024 for fall, left hip pain with no acute traumatic injury.     Headache      Prior to Admission medications  Medication Sig Start Date End Date Taking? Authorizing Provider  albuterol  (VENTOLIN  HFA) 108 (90 Base) MCG/ACT inhaler Inhale 2 puffs into the lungs every 4 (four) hours as needed for wheezing or shortness of breath. 08/06/24   Kehrli, Kelsey F, PA-C    Allergies: Patient has no known allergies.    Review of Systems  Neurological:  Positive for headaches.    Updated Vital Signs BP 133/60   Pulse (!) 57   Temp 98.1 F (36.7 C) (Oral)   Resp 18   Ht 5' (1.524 m)   SpO2 99%   BMI 29.29 kg/m   Physical Exam Vitals and nursing note reviewed.  Constitutional:      General: She is not in acute distress.    Appearance: Normal appearance.  HENT:     Head: Normocephalic and atraumatic.  Eyes:     General: Lids are normal. Vision grossly intact. No visual field deficit.    Extraocular Movements:     Right eye: Normal extraocular motion and no nystagmus.     Left eye: Normal extraocular motion and no nystagmus.     Conjunctiva/sclera: Conjunctivae normal.  Cardiovascular:     Rate and Rhythm: Normal rate.  Pulmonary:     Effort: Pulmonary effort is normal. No  respiratory distress.     Breath sounds: Normal breath sounds.  Musculoskeletal:     Cervical back: Normal range of motion and neck supple. No rigidity.  Skin:    Coloration: Skin is not jaundiced or pale.  Neurological:     Mental Status: She is alert. Mental status is at baseline.     GCS: GCS eye subscore is 4. GCS verbal subscore is 5. GCS motor subscore is 6.     Cranial Nerves: No cranial nerve deficit, dysarthria or facial asymmetry.     Sensory: Sensation is intact. No sensory deficit.     Motor: No weakness, tremor, atrophy, abnormal muscle tone, seizure activity or pronator drift.     Coordination: Coordination normal. Finger-Nose-Finger Test and Heel to [place] Test normal.     Gait: Gait is intact.     Comments: A&Ox2. Does not know current year at baseline. Right eye droop when compared to left. Smile symmetric. Follow commands appropriately. No speech nor visual deficits. Denies dizziness. No facial droop.     (all labs ordered are listed, but only abnormal results are displayed) Labs Reviewed  CBC WITH DIFFERENTIAL/PLATELET - Abnormal; Notable for the following components:      Result Value   Abs Immature Granulocytes 0.08 (*)    All other components within normal limits  COMPREHENSIVE METABOLIC PANEL WITH GFR -  Abnormal; Notable for the following components:   Sodium 133 (*)    Glucose, Bld 135 (*)    BUN <5 (*)    Calcium 8.8 (*)    Total Protein 6.4 (*)    Albumin 3.1 (*)    All other components within normal limits  URINALYSIS, ROUTINE W REFLEX MICROSCOPIC - Abnormal; Notable for the following components:   Color, Urine COLORLESS (*)    Specific Gravity, Urine 1.003 (*)    Hgb urine dipstick MODERATE (*)    All other components within normal limits  CBG MONITORING, ED - Abnormal; Notable for the following components:   Glucose-Capillary 142 (*)    All other components within normal limits  PROTIME-INR  APTT  ETHANOL    EKG: None  Radiology: MR BRAIN WO  CONTRAST Result Date: 08/15/2024 EXAM: MRI BRAIN WITHOUT CONTRAST 08/14/2024 11:59:59 PM TECHNIQUE: Multiplanar multisequence MRI of the head/brain was performed without the administration of intravenous contrast. Truncated examination. Only diffusion and susceptibility weighted imaging was performed. COMPARISON: Head CT 08/14/2024 and 08/06/2024. CLINICAL HISTORY: Neuro deficit, acute, stroke suspected. FINDINGS: BRAIN AND VENTRICLES: No acute infarct. No intracranial hemorrhage. No mass. No midline shift. Unchanged massive ventriculomegaly of the shunted ventricles. Unchanged position of right parietal approach shunt catheter. The sella is unremarkable. Normal flow voids. ORBITS: No acute abnormality. SINUSES AND MASTOIDS: No acute abnormality. BONES AND SOFT TISSUES: Normal marrow signal. No acute soft tissue abnormality. IMPRESSION: 1. Truncated examination. 2. Unchanged massive ventriculomegaly of the shunted ventricles and unchanged position of the right parietal approach shunt catheter. Electronically signed by: Franky Stanford MD 08/15/2024 12:11 AM EST RP Workstation: [id number]   DG Skull 1-3 Views Result Date: 08/14/2024 EXAM: 2 VIEW(S) XRAY OF THE SKULL 08/14/2024 04:53:00 PM COMPARISON: None available. CLINICAL HISTORY: evaluate for shunt malfunction FINDINGS: BONES: Stable intracranial ventriculostomy catheter, better evaluated on CT. No acute fracture. SINUSES: Unremarkable. SOFT TISSUES: Unremarkable. IMPRESSION: 1. Stable intracranial ventriculostomy catheter, better evaluated on CT. Electronically signed by: Pinkie Pebbles MD 08/14/2024 05:58 PM EST RP Workstation: [id number]   CT Head Wo Contrast Result Date: 08/14/2024 EXAM: CT HEAD WITHOUT 08/14/2024 05:35:00 PM TECHNIQUE: CT of the head was performed without the administration of intravenous contrast. Automated exposure control, iterative reconstruction, and/or weight based adjustment of the mA/kV was utilized to reduce the radiation  dose to as low as reasonably achievable. COMPARISON: 08/06/2024 CLINICAL HISTORY: Neuro deficit, acute, stroke suspected FINDINGS: BRAIN AND VENTRICLES: Right parietal approach ventriculostomy catheter in similar position, terminating inferiorly at the foramen magnum. Marked ventriculomegaly, unchanged from priors. No acute intracranial hemorrhage. No mass effect or midline shift. No extra-axial fluid collection. No evidence of acute infarct. ORBITS: No acute abnormality. SINUSES AND MASTOIDS: Partial opacification of the bilateral frontal and ethmoid sinuses. No acute abnormality of the mastoids. SOFT TISSUES AND SKULL: No acute skull fracture. No acute soft tissue abnormality. IMPRESSION: 1. Right parietal approach ventriculostomy catheter in similar position, terminating inferiorly at the foramen magnum. 2. Marked ventriculomegaly, unchanged from prior. 3. No acute intracranial abnormality. Electronically signed by: Pinkie Pebbles MD 08/14/2024 05:57 PM EST RP Workstation: [id number]   DG Cervical Spine 1 View Result Date: 08/14/2024 EXAM: 1 VIEW XRAY OF THE CERVICAL SPINE 08/14/2024 04:53:00 PM COMPARISON: None available. CLINICAL HISTORY: Headache. FINDINGS: BONES: Vertebral body heights are maintained. Alignment is normal. DISCS AND DEGENERATIVE CHANGES: No severe degenerative changes. SOFT TISSUES: No prevertebral soft tissue swelling. No VP shunt catheter is visualized along the neck or upper chest. Correlate with separate CT  head report. The visualized lungs appear clear. IMPRESSION: 1. No ventriculoperitoneal shunt catheter visualized along the neck or upper chest; refer to separate CT head report for intracranial catheter assessment. Electronically signed by: Pinkie Pebbles MD 08/14/2024 05:54 PM EST RP Workstation: [id number]      Medications Ordered in the ED  LORazepam  (ATIVAN ) tablet 1 mg (has no administration in time range)  acetaminophen  (TYLENOL ) tablet 650 mg (650 mg Oral Given  08/14/24 1849)                                    Medical Decision Making Amount and/or Complexity of Data Reviewed Labs: ordered. Radiology: ordered.  Risk OTC drugs. Prescription drug management. Decision regarding hospitalization.   Patient presents to the ED for concern of facial droop, HA this involves an extensive number of treatment options, and is a complaint that carries with it a high risk of complications and morbidity.  The differential diagnosis includes HTN urgency, migraine, ICH, SAH, CVA/TIA   Co morbidities that complicate the patient evaluation   fourth ventricular shunt, intellectual disability, dementia, seizure   Additional history obtained:  Additional history obtained from Nursing and Outside Medical Records   External records from outside source obtained and reviewed including triage note, neurosurgery note from 04/28/2024   Lab Tests:  I Ordered, and personally interpreted labs.  The pertinent results include:   CBG 135 UA with moderate Hgb but noninfectious No leukocytosis   Imaging Studies ordered:  I ordered imaging studies including x-ray skull, cervical spine, CT head, MRI brain I independently visualized and interpreted imaging which showed  Right parietal approach ventriculostomy catheter in similar position, terminating inferiorly at the foramen magnum. Marked ventriculomegaly, unchanged from prior. No acute intracranial abnormality I agree with the radiologist interpretation   Cardiac Monitoring:  The patient was maintained on a cardiac monitor.  I personally viewed and interpreted the cardiac monitored which showed an underlying rhythm of: NSR with no ischemic changes   Medicines ordered and prescription drug management:  I ordered medication including tylenol , ativan   for HA, MRI anxiety  Reevaluation of the patient after these medicines showed that the patient improved I have reviewed the patients home medicines and have  made adjustments as needed     Consultations Obtained:  I requested consultation with neurosurgery Dr. Gillie,  and discussed lab and imaging findings as well as pertinent plan. He reviewed CT imaging Shunt in proper placement Ventriculometry stable I requested consultation with neurology Dr. Merrianne,  and discussed lab and imaging findings as well as pertinent plan. He recommended Admission for TIA workup I requested consultation with hospitalist Dr. Sundil,  and discussed lab and imaging findings as well as pertinent plan. Accepts patient for admission   Problem List / ED Course:  Facial droop Abnormal gait Unsure how long symptoms have been persisting.  Patient's daughter endorsed that she noticed symptoms when she came to visit patient at 1430 today.  Staff was not aware of any symptoms On exam there were no neurological deficits at this time other than a right eye facial droop. Sister reports this has been intermittently occurring over past month. Smile is symmetric. Sensation to face 2/2 bilaterally. No motor nor sensory deficits to BUE and BLE  Unfortunately patient is unable to contribute to history. Sister is not with patient when she is a SNF so is not the best historian as well.  Labs noncontributory  to etiology. No signs of infection, drug toxidrome, electrolyte abnormality.  CT head wo ICH, mass. MRI pending at admission Neurosurgery reports shunt is in correct alignment and ventriculomegaly is stable and likely not contributing to symptoms ABCD score 3-5 as family does not know how long facial drop, right sided leaning was occurring today and it is unclear whether patient was experiencing true weakness with ambulation when sister reported leaning to right side when walking. No hx of diabetes nor CVA   HA Provided tylenol  for pain with resolution of headache No HTN nor visual disturbances. No consistent with htn urgency   Reevaluation:  After the interventions noted  above, I reevaluated the patient and found that they have :stayed the same     Dispostion:  After reviewing ED workup, think patient would benefit from admission for TIA workup.   Discussed ED workup, disposition, return to ED precautions with patient who expresses understanding agrees with plan.  All questions answered to their satisfaction.  They are agreeable to plan.    Final diagnoses:  Facial droop  Abnormal gait    ED Discharge Orders     None        Minnie Tinnie BRAVO, PA 08/15/24 0016    Minnie Tinnie BRAVO, PA 08/15/24 0016    Minnie Tinnie BRAVO, PA 08/15/24 0023    Ellouise Richerd POUR, DO 08/15/24 1457

## 2024-08-14 NOTE — ED Triage Notes (Signed)
 Pt BIB [facility] from [facility] for stroke sx and a headache x3 days. Per EMS, facility and pt friend noticed R side facial droop and pt leaning to the right 3 days ago. AO4, hx autism and spina bifida. Vss.

## 2024-08-14 NOTE — ED Provider Notes (Incomplete)
°  [place] [facility] AT [facility] Provider Note   CSN: [id number] Arrival date & time: 08/14/24  1544     Patient presents with: Headache and Facial Droop   Angela Caldwell is a 73 y.o. female.  {Add pertinent medical, surgical, social history, OB history to HPI:32947}  Headache      Prior to Admission medications  Medication Sig Start Date End Date Taking? Authorizing Provider  albuterol  (VENTOLIN  HFA) 108 (90 Base) MCG/ACT inhaler Inhale 2 puffs into the lungs every 4 (four) hours as needed for wheezing or shortness of breath. 08/06/24   Kehrli, Kelsey F, PA-C    Allergies: Patient has no known allergies.    Review of Systems  Neurological:  Positive for headaches.    Updated Vital Signs BP 137/67 (BP Location: Right Arm)   Pulse 70   Temp 97.7 F (36.5 C) (Oral)   Resp 17   Ht 5' (1.524 m)   SpO2 99%   BMI 29.29 kg/m   Physical Exam  (all labs ordered are listed, but only abnormal results are displayed) Labs Reviewed  CBC WITH DIFFERENTIAL/PLATELET  COMPREHENSIVE METABOLIC PANEL WITH GFR  URINALYSIS, ROUTINE W REFLEX MICROSCOPIC  CBG MONITORING, ED    EKG: None  Radiology: No results found.  {Document cardiac monitor, telemetry assessment procedure when appropriate:32947} Procedures   Medications Ordered in the ED  LORazepam (ATIVAN) tablet 1 mg (has no administration in time range)      {Click here for ABCD2, HEART and other calculators REFRESH Note before signing:1}                              Medical Decision Making Amount and/or Complexity of Data Reviewed Labs: ordered. Radiology: ordered.  Risk Prescription drug management.   ***  {Document critical care time when appropriate  Document review of labs and clinical decision tools ie CHADS2VASC2, etc  Document your independent review of radiology images and any outside records  Document your discussion with family members, caretakers and with consultants   Document social determinants of health affecting pt's care  Document your decision making why or why not admission, treatments were needed:32947:::1}   Final diagnoses:  None    ED Discharge Orders     None

## 2024-08-15 ENCOUNTER — Encounter (HOSPITAL_COMMUNITY): Payer: Self-pay | Admitting: Internal Medicine

## 2024-08-15 ENCOUNTER — Observation Stay (HOSPITAL_COMMUNITY)

## 2024-08-15 DIAGNOSIS — R519 Headache, unspecified: Secondary | ICD-10-CM | POA: Insufficient documentation

## 2024-08-15 DIAGNOSIS — Q059 Spina bifida, unspecified: Secondary | ICD-10-CM | POA: Insufficient documentation

## 2024-08-15 DIAGNOSIS — Z982 Presence of cerebrospinal fluid drainage device: Secondary | ICD-10-CM | POA: Insufficient documentation

## 2024-08-15 DIAGNOSIS — H02401 Unspecified ptosis of right eyelid: Principal | ICD-10-CM

## 2024-08-15 DIAGNOSIS — G459 Transient cerebral ischemic attack, unspecified: Secondary | ICD-10-CM

## 2024-08-15 DIAGNOSIS — Z87898 Personal history of other specified conditions: Secondary | ICD-10-CM

## 2024-08-15 LAB — COMPREHENSIVE METABOLIC PANEL WITH GFR
ALT: 8 U/L (ref 0–44)
AST: 12 U/L — ABNORMAL LOW (ref 15–41)
Albumin: 2.6 g/dL — ABNORMAL LOW (ref 3.5–5.0)
Alkaline Phosphatase: 65 U/L (ref 38–126)
Anion gap: 9 (ref 5–15)
BUN: 5 mg/dL — ABNORMAL LOW (ref 8–23)
CO2: 23 mmol/L (ref 22–32)
Calcium: 8.4 mg/dL — ABNORMAL LOW (ref 8.9–10.3)
Chloride: 101 mmol/L (ref 98–111)
Creatinine, Ser: 0.73 mg/dL (ref 0.44–1.00)
GFR, Estimated: >60 mL/min (ref >60)
Glucose, Bld: 134 mg/dL — ABNORMAL HIGH (ref 70–99)
Potassium: 3.7 mmol/L (ref 3.5–5.1)
Sodium: 133 mmol/L — ABNORMAL LOW (ref 135–145)
Total Bilirubin: 0.3 mg/dL (ref 0.0–1.2)
Total Protein: 5.5 g/dL — ABNORMAL LOW (ref 6.5–8.1)

## 2024-08-15 LAB — LIPID PANEL
Cholesterol: 153 mg/dL (ref 0–200)
HDL: 60 mg/dL (ref >40)
LDL Cholesterol: 71 mg/dL (ref 0–99)
Total CHOL/HDL Ratio: 2.6 ratio
Triglycerides: 110 mg/dL (ref <150)
VLDL: 22 mg/dL (ref 0–40)

## 2024-08-15 LAB — CBC
HCT: 37.4 % (ref 36.0–46.0)
Hemoglobin: 12.7 g/dL (ref 12.0–15.0)
MCH: 31.1 pg (ref 26.0–34.0)
MCHC: 34 g/dL (ref 30.0–36.0)
MCV: 91.7 fL (ref 80.0–100.0)
Platelets: 280 K/uL (ref 150–400)
RBC: 4.08 MIL/uL (ref 3.87–5.11)
RDW: 13 % (ref 11.5–15.5)
WBC: 11 K/uL — ABNORMAL HIGH (ref 4.0–10.5)
nRBC: 0 % (ref 0.0–0.2)

## 2024-08-15 LAB — HEMOGLOBIN A1C
Hgb A1c MFr Bld: 5 % (ref 4.8–5.6)
Mean Plasma Glucose: 96.8 mg/dL

## 2024-08-15 MED ORDER — MIDAZOLAM 5 MG/0.1ML NA SOLN: 5.0000 mg | NASAL | 0 refills | Status: AC | PRN

## 2024-08-15 MED ORDER — SENNOSIDES-DOCUSATE SODIUM 8.6-50 MG PO TABS: 1.0000 | ORAL_TABLET | Freq: Every evening | ORAL | Status: DC | PRN

## 2024-08-15 MED ORDER — ACETAMINOPHEN 325 MG PO TABS: 650.0000 mg | ORAL_TABLET | ORAL | Status: DC | PRN

## 2024-08-15 MED ORDER — STROKE: EARLY STAGES OF RECOVERY BOOK: Freq: Once | Status: DC

## 2024-08-15 MED ORDER — LEVALBUTEROL HCL 0.63 MG/3ML IN NEBU: 0.6300 mg | INHALATION_SOLUTION | Freq: Four times a day (QID) | RESPIRATORY_TRACT | Status: DC | PRN

## 2024-08-15 MED ORDER — DONEPEZIL HCL 10 MG PO TABS
10.0000 mg | ORAL_TABLET | Freq: Every day | ORAL | Status: DC
  Administered 2024-08-15: 10 mg via ORAL
  Filled 2024-08-15: qty 1

## 2024-08-15 MED ORDER — ASPIRIN 81 MG PO TBEC: 81.0000 mg | DELAYED_RELEASE_TABLET | Freq: Every day | ORAL | Status: DC

## 2024-08-15 MED ORDER — MEMANTINE HCL ER 28 MG PO CP24: 28.0000 mg | ORAL_CAPSULE | Freq: Every day | ORAL | Status: DC

## 2024-08-15 MED ORDER — ALBUTEROL SULFATE (2.5 MG/3ML) 0.083% IN NEBU: 3.0000 mL | INHALATION_SOLUTION | RESPIRATORY_TRACT | Status: DC | PRN

## 2024-08-15 MED ORDER — DIVALPROEX SODIUM ER 500 MG PO TB24
500.0000 mg | ORAL_TABLET | Freq: Two times a day (BID) | ORAL | Status: DC
  Administered 2024-08-15: 500 mg via ORAL
  Filled 2024-08-15: qty 1

## 2024-08-15 MED ORDER — ENOXAPARIN SODIUM 40 MG/0.4ML IJ SOSY
40.0000 mg | PREFILLED_SYRINGE | INTRAMUSCULAR | Status: DC
  Administered 2024-08-15: 40 mg via SUBCUTANEOUS
  Filled 2024-08-15: qty 0.4

## 2024-08-15 MED ORDER — IOHEXOL 350 MG/ML SOLN
75.0000 mL | Freq: Once | INTRAVENOUS | Status: AC | PRN
  Administered 2024-08-15: 75 mL via INTRAVENOUS

## 2024-08-15 MED ORDER — MEMANTINE HCL 10 MG PO TABS
10.0000 mg | ORAL_TABLET | Freq: Every day | ORAL | Status: DC
  Administered 2024-08-15: 10 mg via ORAL
  Filled 2024-08-15: qty 1

## 2024-08-15 MED ORDER — SODIUM CHLORIDE 0.9 % IV SOLN: INTRAVENOUS | Status: DC

## 2024-08-15 MED ORDER — GUAIFENESIN ER 600 MG PO TB12
600.0000 mg | ORAL_TABLET | Freq: Two times a day (BID) | ORAL | Status: DC
  Administered 2024-08-15 (×2): 600 mg via ORAL
  Filled 2024-08-15 (×2): qty 1

## 2024-08-15 MED ORDER — ACETAMINOPHEN 160 MG/5ML PO SOLN: 650.0000 mg | ORAL | Status: DC | PRN

## 2024-08-15 MED ORDER — FAMOTIDINE 20 MG PO TABS
20.0000 mg | ORAL_TABLET | Freq: Every day | ORAL | Status: DC
  Administered 2024-08-15: 20 mg via ORAL
  Filled 2024-08-15: qty 1

## 2024-08-15 MED ORDER — MIDAZOLAM 5 MG/0.1ML NA SOLN: 5.0000 mg | NASAL | 0 refills | Status: DC | PRN

## 2024-08-15 MED ORDER — ASPIRIN 325 MG PO TABS
325.0000 mg | ORAL_TABLET | Freq: Once | ORAL | Status: AC
  Administered 2024-08-15: 325 mg via ORAL
  Filled 2024-08-15: qty 1

## 2024-08-15 MED ORDER — ACETAMINOPHEN 650 MG RE SUPP: 650.0000 mg | RECTAL | Status: DC | PRN

## 2024-08-15 MED ORDER — PRAVASTATIN SODIUM 40 MG PO TABS: 40.0000 mg | ORAL_TABLET | Freq: Every day | ORAL | Status: DC

## 2024-08-15 NOTE — ED Notes (Signed)
 Update provided to Sister/Guardian, [name].

## 2024-08-15 NOTE — H&P (Addendum)
 History and Physical    Angela Caldwell FMW:[id number] DOB: 04-30-1951 DOA: 08/14/2024  PCP: Pcp, No   Patient coming from: ALF   Chief Complaint:  Chief Complaint  Patient presents with   Headache   Facial Droop   ED TRIAGE note:  Pt BIB [facility] from [facility] for stroke sx and a headache x3 days. Per EMS, facility and pt friend noticed R side facial droop and pt leaning to the right 3 days ago. AO4, hx autism and spina bifida. Vss.    HPI:  Angela Caldwell is a 73 y.o. female with medical history significant of spina bifida, memory impairment, seizure-like activity, chronic gait difficulty, chronic headache, intellectual disability, communicating hydrocephalus with VP shunt presented to emergency department via EMS from [facility] for evaluation for right-sided facial droop, leaning towards the right side when ambulating when her sister came to visit her today around 2:30 PM in the afternoon.  Staff from [facility] reported that she was complaining about headache for last 24 hours.  At baseline patient is alert alert and oriented however currently unable to specify current year.  Patient was evaluated in the emergency department 12/4 for fall left-sided hip pain without any acute traumatic injury.  At presentation to ED patient does not have any neurological deficit except right eye facial droop.  No motor and sensory deficit of the bilateral upper or lower extremity.  Patient has unstable gait at baseline.  Patient is resting comfortably in the bed and answering question appropriately.   ED Course:  At presentation to ED patient is hemodynamically stable. Lab, CBC unremarkable.  CMP showing sodium 133 which is at baseline, otherwise unremarkable.  Normal blood alcohol level.  UA no evidence of UTI.  Normal APTT, pro time and INR.  X-ray cervical spine no evidence ofventriculoperitoneal shunt catheter visualized along the neck or upper chest; refer to separate CT head report for  intracranial catheter assessment.  X-rays: Stable intracranial ventriculostomy catheter, better evaluated on CT   CT head findings following  1. Right parietal approach ventriculostomy catheter in similar position, terminating inferiorly at the foramen magnum. 2. Marked ventriculomegaly, unchanged from prior. 3. No acute intracranial abnormality.  MRI of the brain showed:Truncated examination. 2. Unchanged massive ventriculomegaly of the shunted ventricles and unchanged position of the right parietal approach shunt catheter.   ED physician discussed case with neurosurgery Dr. Gillie who reviewed the lab and imaging.  Reported that shunt in proper place and ventriculomegaly stable.  EDP also consulted and discussed case with neurology Dr.Lindzen recommended admit patient for TIA workup.  Hospitalist consulted for further evaluation management of TIA.  Significant labs in the ED: Lab Orders         CBC with Differential         Comprehensive metabolic panel         Urinalysis, Routine w reflex microscopic -Urine, Clean Catch         Protime-INR         APTT         Ethanol         Lipid panel         Hemoglobin A1c         Comprehensive metabolic panel         CBC         CBG monitoring, ED       Review of Systems:  Review of Systems  Respiratory:  Negative for cough and shortness of breath.   Cardiovascular:  Negative for chest pain and palpitations.  Neurological:  Negative for dizziness, tingling, sensory change, speech change, focal weakness, loss of consciousness, weakness and headaches.  Psychiatric/Behavioral:  The patient is not nervous/anxious.     Past Medical History:  Diagnosis Date   Intellectual disability    Seizure [facility])     History reviewed. No pertinent surgical history.   reports that she has never smoked. She has never used smokeless tobacco. She reports that she does not drink alcohol and does not use drugs.  Allergies[1]  History reviewed. No  pertinent family history.  Prior to Admission medications  Medication Sig Start Date End Date Taking? Authorizing Provider  albuterol  (VENTOLIN  HFA) 108 (90 Base) MCG/ACT inhaler Inhale 2 puffs into the lungs every 4 (four) hours as needed for wheezing or shortness of breath. 08/06/24   Alva Larraine FALCON, PA-C     Physical Exam: Vitals:   08/14/24 2315 08/14/24 2324 08/14/24 2330 08/15/24 0015  BP:      Pulse: (!) 53  60   Resp: 18  18 18   Temp:  98.1 F (36.7 C)    TempSrc:  Oral    SpO2: 98%  99%   Height:        Physical Exam Constitutional:      General: She is not in acute distress.    Appearance: She is not ill-appearing.  Eyes:     General: No visual field deficit. Cardiovascular:     Rate and Rhythm: Normal rate and regular rhythm.  Pulmonary:     Effort: Pulmonary effort is normal.     Breath sounds: Normal breath sounds.  Abdominal:     General: Bowel sounds are normal.     Palpations: Abdomen is soft.  Musculoskeletal:        General: Normal range of motion.  Skin:    General: Skin is warm.  Neurological:     Cranial Nerves: No cranial nerve deficit, dysarthria or facial asymmetry.     Sensory: No sensory deficit.     Motor: No weakness.  Psychiatric:        Mood and Affect: Mood is not anxious or depressed.        Cognition and Memory: Cognition is impaired. Memory is impaired.      Labs on Admission: I have personally reviewed following labs and imaging studies  CBC: Recent Labs  Lab 08/14/24 1659  WBC 8.2  NEUTROABS 7.0  HGB 13.5  HCT 40.0  MCV 90.1  PLT 300   Basic Metabolic Panel: Recent Labs  Lab 08/14/24 1659  NA 133*  K 5.0  CL 99  CO2 22  GLUCOSE 135*  BUN <5*  CREATININE 0.69  CALCIUM 8.8*   GFR: CrCl cannot be calculated (Unknown ideal weight.). Liver Function Tests: Recent Labs  Lab 08/14/24 1659  AST 19  ALT 10  ALKPHOS 71  BILITOT 0.9  PROT 6.4*  ALBUMIN 3.1*   No results for input(s): LIPASE, AMYLASE  in the last 168 hours. No results for input(s): AMMONIA in the last 168 hours. Coagulation Profile: Recent Labs  Lab 08/14/24 1645  INR 0.9   Cardiac Enzymes: No results for input(s): CKTOTAL, CKMB, CKMBINDEX, TROPONINI, TROPONINIHS in the last 168 hours. BNP (last 3 results) No results for input(s): BNP in the last 8760 hours. HbA1C: No results for input(s): HGBA1C in the last 72 hours. CBG: Recent Labs  Lab 08/14/24 1603  GLUCAP 142*   Lipid Profile: No results for input(s): CHOL,  HDL, LDLCALC, TRIG, CHOLHDL, LDLDIRECT in the last 72 hours. Thyroid Function Tests: No results for input(s): TSH, T4TOTAL, FREET4, T3FREE, THYROIDAB in the last 72 hours. Anemia Panel: No results for input(s): VITAMINB12, FOLATE, FERRITIN, TIBC, IRON, RETICCTPCT in the last 72 hours. Urine analysis:    Component Value Date/Time   COLORURINE COLORLESS (A) 08/14/2024 1900   APPEARANCEUR CLEAR 08/14/2024 1900   LABSPEC 1.003 (L) 08/14/2024 1900   PHURINE 8.0 08/14/2024 1900   GLUCOSEU NEGATIVE 08/14/2024 1900   HGBUR MODERATE (A) 08/14/2024 1900   BILIRUBINUR NEGATIVE 08/14/2024 1900   KETONESUR NEGATIVE 08/14/2024 1900   PROTEINUR NEGATIVE 08/14/2024 1900   NITRITE NEGATIVE 08/14/2024 1900   LEUKOCYTESUR NEGATIVE 08/14/2024 1900    Radiological Exams on Admission: I have personally reviewed images MR BRAIN WO CONTRAST Result Date: 08/15/2024 EXAM: MRI BRAIN WITHOUT CONTRAST 08/14/2024 11:59:59 PM TECHNIQUE: Multiplanar multisequence MRI of the head/brain was performed without the administration of intravenous contrast. Truncated examination. Only diffusion and susceptibility weighted imaging was performed. COMPARISON: Head CT 08/14/2024 and 08/06/2024. CLINICAL HISTORY: Neuro deficit, acute, stroke suspected. FINDINGS: BRAIN AND VENTRICLES: No acute infarct. No intracranial hemorrhage. No mass. No midline shift. Unchanged massive  ventriculomegaly of the shunted ventricles. Unchanged position of right parietal approach shunt catheter. The sella is unremarkable. Normal flow voids. ORBITS: No acute abnormality. SINUSES AND MASTOIDS: No acute abnormality. BONES AND SOFT TISSUES: Normal marrow signal. No acute soft tissue abnormality. IMPRESSION: 1. Truncated examination. 2. Unchanged massive ventriculomegaly of the shunted ventricles and unchanged position of the right parietal approach shunt catheter. Electronically signed by: Franky Stanford MD 08/15/2024 12:11 AM EST RP Workstation: [id number]   DG Skull 1-3 Views Result Date: 08/14/2024 EXAM: 2 VIEW(S) XRAY OF THE SKULL 08/14/2024 04:53:00 PM COMPARISON: None available. CLINICAL HISTORY: evaluate for shunt malfunction FINDINGS: BONES: Stable intracranial ventriculostomy catheter, better evaluated on CT. No acute fracture. SINUSES: Unremarkable. SOFT TISSUES: Unremarkable. IMPRESSION: 1. Stable intracranial ventriculostomy catheter, better evaluated on CT. Electronically signed by: Pinkie Pebbles MD 08/14/2024 05:58 PM EST RP Workstation: [id number]   CT Head Wo Contrast Result Date: 08/14/2024 EXAM: CT HEAD WITHOUT 08/14/2024 05:35:00 PM TECHNIQUE: CT of the head was performed without the administration of intravenous contrast. Automated exposure control, iterative reconstruction, and/or weight based adjustment of the mA/kV was utilized to reduce the radiation dose to as low as reasonably achievable. COMPARISON: 08/06/2024 CLINICAL HISTORY: Neuro deficit, acute, stroke suspected FINDINGS: BRAIN AND VENTRICLES: Right parietal approach ventriculostomy catheter in similar position, terminating inferiorly at the foramen magnum. Marked ventriculomegaly, unchanged from priors. No acute intracranial hemorrhage. No mass effect or midline shift. No extra-axial fluid collection. No evidence of acute infarct. ORBITS: No acute abnormality. SINUSES AND MASTOIDS: Partial opacification of the  bilateral frontal and ethmoid sinuses. No acute abnormality of the mastoids. SOFT TISSUES AND SKULL: No acute skull fracture. No acute soft tissue abnormality. IMPRESSION: 1. Right parietal approach ventriculostomy catheter in similar position, terminating inferiorly at the foramen magnum. 2. Marked ventriculomegaly, unchanged from prior. 3. No acute intracranial abnormality. Electronically signed by: Pinkie Pebbles MD 08/14/2024 05:57 PM EST RP Workstation: [id number]   DG Cervical Spine 1 View Result Date: 08/14/2024 EXAM: 1 VIEW XRAY OF THE CERVICAL SPINE 08/14/2024 04:53:00 PM COMPARISON: None available. CLINICAL HISTORY: Headache. FINDINGS: BONES: Vertebral body heights are maintained. Alignment is normal. DISCS AND DEGENERATIVE CHANGES: No severe degenerative changes. SOFT TISSUES: No prevertebral soft tissue swelling. No VP shunt catheter is visualized along the neck or upper chest. Correlate with separate CT head  report. The visualized lungs appear clear. IMPRESSION: 1. No ventriculoperitoneal shunt catheter visualized along the neck or upper chest; refer to separate CT head report for intracranial catheter assessment. Electronically signed by: Pinkie Pebbles MD 08/14/2024 05:54 PM EST RP Workstation: [id number]     EKG: My personal interpretation of EKG shows: Normal sinus rhythm heart rate 72.    Assessment/Plan: Principal Problem:   Drooping eyelid, right Active Problems:   Communicating hydrocephalus (HCC)   Memory impairment   Intellectual disability   History of unsteady gait   Spina bifida (HCC)   Chronic headache   History of seizure   S/P VP shunt    Assessment and Plan: Right-sided facial droop-resolved Right-sided eyelid droop Concern for TIA History of unsteady gait History of hydrocephalus with VP shunt (follows neurosurgery with [facility]) History of spina bifida -Presented to emergency department as patient's sister noticed that she is loading more  towards left side and have some facial droop reported by nursing home.  At presentation to ED patient does not have any upper or lower extremity sensory deficient and at baseline however noticed to have slight right-sided eyelid droop. -At presentation to ED patient is hemodynamically stable. Lab, CBC unremarkable.  CMP showing sodium 133 which is at baseline, otherwise unremarkable.  Normal blood alcohol level.  UA no evidence of UTI.  Normal APTT, pro time and INR. - X-ray cervical spine no evidence ofventriculoperitoneal shunt catheter visualized along the neck or upper chest; refer to separate CT head report for intracranial catheter assessment. - X-rays: Stable intracranial ventriculostomy catheter, better evaluated on CT . - CT head findings following  1. Right parietal approach ventriculostomy catheter in similar position, terminating inferiorly at the foramen magnum. 2. Marked ventriculomegaly, unchanged from prior. 3. No acute intracranial abnormality. - MRI of the brain showed:Truncated examination. 2. Unchanged massive ventriculomegaly of the shunted ventricles and unchanged position of the right parietal approach shunt catheter. - ED physician discussed case with neurosurgery Dr. Gillie who reviewed the lab and imaging.  Reported that shunt in proper place and ventriculomegaly stable.  EDP also consulted and discussed case with neurology Dr.Lindzen recommended admit patient for TIA workup. -Obtaining CT angio head and neck. -Continue neurocheck every 6 hours. - Check A1c lipid panel. -Giving aspirin  325mg  followed by continue home aspirin  81 mg daily and Lipitor at 40 mg daily. -Obtain echocardiogram. - Continue cardiac monitoring. -Consulted PT and OT.  Obtain stroke swallow screen followed by continue diet and oral medications if patient does not shows any evidence of dysphagia. -TIA/stroke order set has been utilized.   History of seizure-like activity -Continue Depakote  ER  500 mg daily  Intellectual disability History of unsteady gait - Continue fall precaution.  Chronic headache -Continue Tylenol  as needed  History of memory impairment Generalized anxiety disorder - Continue Aricept  10 mg daily, and memantine  28 mg daily   DVT prophylaxis:  Lovenox , SCD TED hose Code Status:  Full Code Diet: Heart healthy diet Family Communication:   Family was present at bedside, at the time of interview. Opportunity was given to ask question and all questions were answered satisfactorily.  Disposition Plan: Continue to monitor improvement of right eyelid droop. Consults: Neurosurgery and neurology Admission status:   Observation, Telemetry bed  Severity of Illness: The appropriate patient status for this patient is OBSERVATION. Observation status is judged to be reasonable and necessary in order to provide the required intensity of service to ensure the patient's safety. The patient's presenting symptoms, physical exam  findings, and initial radiographic and laboratory data in the context of their medical condition is felt to place them at decreased risk for further clinical deterioration. Furthermore, it is anticipated that the patient will be medically stable for discharge from the hospital within 2 midnights of admission.     Angela Monts, MD [facility]  How to contact the [facility] Attending or Consulting provider 7A - 7P or covering provider during after hours 7P -7A, for this patient.  Check the care team in [facility] and look for a) attending/consulting [facility] provider listed and b) the [facility] team listed Log into [email or web address] and use [place]'s universal password to access. If you do not have the password, please contact the hospital operator. Locate the [facility] provider you are looking for under [facility] and page to a number that you can be directly reached. If you still have difficulty reaching the provider, please page the [facility] (Director on Call) for  the Hospitalists listed on amion for assistance.  08/15/2024, 12:40 AM           [1] No Known Allergies

## 2024-08-15 NOTE — Evaluation (Addendum)
 Physical Therapy Evaluation Patient Details Name: Angela Caldwell MRN: [id number] DOB: 16-Apr-1951 Today's Date: 08/15/2024  History of Present Illness  73 y/o F presenting to ED on 12/12 from [facility] with R facial droop and headache x3 days, MRI with unchanged massive ventriculomegaly of shunted ventricles and unchanged position of R paerital Shunt catheter. CTA with no acute intracrainal change.Recentaly evaluated 12/4 for fall with L hip pain with no traumatic injury.    PMH includes intellectual disability, spina bifida.   Clinical Impression  Pt admitted with above diagnosis. Coming from [facility] where she reports she is ambulatory with a RW and is able to bath/dress herself. States she no longer receives PT/OT. Questionable historian. Pleasant to work with. Required min assist for bed mobility, transfers training, and gait with RW. VSS on RA throughout. Feel she is appropriate for return prior facility for continued rehab as deemed fit by staff at [facility]. Suspect she has had a mild functional decline based on reported hx. Patient will continue to benefit from skilled physical therapy services to further improve independence with functional mobility.  Pt currently with functional limitations due to the deficits listed below (see PT Problem List). Pt will benefit from acute skilled PT to increase their independence and safety with mobility to allow discharge.           If plan is discharge home, recommend the following: A little help with walking and/or transfers;A little help with bathing/dressing/bathroom;Assistance with cooking/housework;Assist for transportation   Can travel by private vehicle   Yes    Equipment Recommendations None recommended by PT  Recommendations for Other Services       Functional Status Assessment Patient has had a recent decline in their functional status and demonstrates the ability to make significant improvements in function in a reasonable and  predictable amount of time.     Precautions / Restrictions Precautions Precautions: Fall Recall of Precautions/Restrictions: Impaired Restrictions Weight Bearing Restrictions Per Provider Order: No      Mobility  Bed Mobility Overal bed mobility: Needs Assistance Bed Mobility: Supine to Sit, Sit to Supine     Supine to sit: Min assist, HOB elevated Sit to supine: Min assist   General bed mobility comments: Min assist to rise and scoot to EOB and return to bed. safely. Heavy VC to facilitate.    Transfers Overall transfer level: Needs assistance Equipment used: Rolling walker (2 wheels), 1 person hand held assist Transfers: Sit to/from Stand, Bed to chair/wheelchair/BSC Sit to Stand: Min assist   Step pivot transfers: Min assist       General transfer comment: Min assist to stand from stretcher (high surface, guided feet to floor and stabilize.) Min assist for balance with hand held support to step pivot to [facility] with episode of urinary incontinence.  Min assist to rise from [facility] with RW. Cues for technique and hand placement.    Ambulation/Gait Ambulation/Gait assistance: Min assist Gait Distance (Feet): 150 Feet Assistive device: Rolling walker (2 wheels) Gait Pattern/deviations: Step-through pattern, Decreased stride length, Trunk flexed, Drifts right/left, Shuffle Gait velocity: dec Gait velocity interpretation: <1.8 ft/sec, indicate of risk for recurrent falls   General Gait Details: Educated on safe AD use with RW, intermittent assist to controll device, no overt buckling but demonstrates intermittent shuffling and drift. Frequent cues for upright stance and proximity to device with poor and limited carryover.  Stairs            [employer]  Bed    Modified Rankin (Stroke Patients Only)       Balance Overall balance assessment: Needs assistance Sitting-balance support: No upper extremity supported, Feet supported Sitting  balance-Leahy Scale: Good     Standing balance support: Single extremity supported Standing balance-Leahy Scale: Poor Standing balance comment: CGA with single UE supported.                             Pertinent Vitals/Pain Pain Assessment Pain Assessment: Faces Faces Pain Scale: Hurts little more Pain Location: Rt arm IV site Pain Descriptors / Indicators: Aching Pain Intervention(s): Limited activity within patient's tolerance, Monitored during session, Repositioned    Home Living Family/patient expects to be discharged to:: Skilled nursing facility                   Additional Comments: Pt reports living at [facility]    Prior Function Prior Level of Function : Needs assist;History of Falls (last six months)             Mobility Comments: Reports using RW for mobility. Reports hx of falls. ADLs Comments: States she can bath and dress herself, walks to dining hall at living facility     Extremity/Trunk Assessment   Upper Extremity Assessment Upper Extremity Assessment: Defer to OT evaluation    Lower Extremity Assessment Lower Extremity Assessment: Generalized weakness       Communication   Communication Communication: No apparent difficulties    Cognition Arousal: Alert Behavior During Therapy: WFL for tasks assessed/performed   PT - Cognitive impairments: History of cognitive impairments, No family/caregiver present to determine baseline                         Following commands: Impaired Following commands impaired: Follows one step commands inconsistently     Cueing Cueing Techniques: Verbal cues, Tactile cues, Gestural cues     General Comments General comments (skin integrity, edema, etc.): VSS throughout on RA.    Exercises     Assessment/Plan    PT Assessment Patient needs continued PT services  PT Problem List Decreased strength;Decreased activity tolerance;Decreased balance;Decreased mobility;Decreased  cognition;Decreased knowledge of use of DME;Decreased safety awareness;Decreased knowledge of precautions       PT Treatment Interventions DME instruction;Gait training;Functional mobility training;Therapeutic activities;Therapeutic exercise;Balance training;Neuromuscular re-education;Cognitive remediation;Patient/family education    PT Goals (Current goals can be found in the Care Plan section)  Acute Rehab PT Goals Patient Stated Goal: Return to [place] PT Goal Formulation: With patient Time For Goal Achievement: 08/29/24 Potential to Achieve Goals: Good    Frequency Min 1X/week     Co-evaluation               AM-PAC PT 6 Clicks Mobility  Outcome Measure Help needed turning from your back to your side while in a flat bed without using bedrails?: A Little Help needed moving from lying on your back to sitting on the side of a flat bed without using bedrails?: A Little Help needed moving to and from a bed to a chair (including a wheelchair)?: A Little Help needed standing up from a chair using your arms (e.g., wheelchair or bedside chair)?: A Little Help needed to walk in hospital room?: A Little Help needed climbing 3-5 steps with a railing? : A Lot 6 Click Score: 17    End of Session Equipment Utilized During Treatment: Gait belt Activity Tolerance: Patient tolerated  treatment well Patient left: in bed;with call bell/phone within reach;with nursing/sitter in room (OT assuming care) Nurse Communication: Mobility status PT Visit Diagnosis: Unsteadiness on feet (R26.81);Other abnormalities of gait and mobility (R26.89);Muscle weakness (generalized) (M62.81);History of falling (Z91.81);Difficulty in walking, not elsewhere classified (R26.2);Other symptoms and signs involving the nervous system (R29.898)    Time: 9073-9044 PT Time Calculation (min) (ACUTE ONLY): 29 min   Charges:   PT Evaluation $PT Eval Low Complexity: 1 Low PT Treatments $Gait Training: 8-22 mins PT  General Charges $$ ACUTE PT VISIT: 1 Visit        Addendum: visit time updated - fixed.  Leontine Roads, PT, DPT [facility]  Re[facility] Services Physical Therapist Office: [phone] Website: [email or web address]   Leontine GORMAN Roads 08/15/2024, 12:45 PM

## 2024-08-15 NOTE — Progress Notes (Signed)
 Patient ID: Angela Caldwell, female   DOB: Jun 17, 1951, 73 y.o.   MRN: [id number] I was called regarding Angela Caldwell due to Dr. Lindzen recommendation for a neurosurgery consultation.  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 She has a catheter which is in the ventricle and terminates in the subarachnoid space at C1. There is no valve. There is no way to evaluate the catheter. She has neurosurgeons at [facility] who actively treat her. There is no indication currently for an urgent or emergent assessment of this catheter. I recommend she see her treating physician for this. Dr. Merrianne mistakenly stated she had a VP shunt, she does not. The catheter has not changed position for at least 15 years, it is not kinked on the ct. Unable to correlate her symptoms with the unchanged imaging.

## 2024-08-15 NOTE — Consult Note (Signed)
 NEUROLOGY CONSULT NOTE   Date of service: August 15, 2024 Patient Name: Angela Caldwell MRN:  [id number] DOB:  01-04-1951 Chief Complaint: [name] with right facial droop while ambulating with walker Requesting Provider: Sundil, Subrina, MD  History of Present Illness  Angela Caldwell is a 73 y.o. 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 Her sister also notes progressive weakness of her right eye, which has gradually been deviating more medially over the past month or so. Her ambulation has also worsened over several months, from a mildly decreased stride length to a shuffling gait. Although sister states she noticed this on Friday, Triage RN note documents facility and pt friend noticed R side facial droop and pt leaning to the right 3 days ago. The patient is not complaining of any headaches or any other new symptoms, in the context of her intellectual impairment. No seizures reported.   Facility medications include Depakote  ER 500 mg every day, as well as donepezil  and memantine .    ROS  Comprehensive ROS obtained from her sister was performed and pertinent positives documented in HPI    Past History   Past Medical History:  Diagnosis Date   Intellectual disability    Seizure [facility])     History reviewed. No pertinent surgical history.  Family History: History reviewed. No pertinent family history.  Social History  reports that she has never smoked. She has never used smokeless tobacco. She reports that she does not drink alcohol and does not  use drugs.  Allergies[1]  Medications  Current Medications[2]  Vitals   Vitals:   08/14/24 2315 08/14/24 2324 08/14/24 2330 08/15/24 0015  BP:      Pulse: (!) 53  60   Resp: 18  18 18   Temp:  98.1 F (36.7 C)    TempSrc:  Oral    SpO2: 98%  99%   Height:        Body mass index is 29.29 kg/m.   Physical Exam   Constitutional: Appears well-developed and well-nourished.  Psych: Bright affect, with frequent laughing.  Eyes: No scleral injection.  HENT: No OP obstruction.  Head: Normocephalic.  Respiratory: Effort normal, non-labored breathing.  Skin: WDI.   Neurologic Examination   Mental Status: Awake and alert. Able to state her name. Bright affect. Laughs frequently during exam and interview. Will make eye contact with her left eye. Follows all commands. Speech is fluent with chronically intellectual disability, with decreased complexity of her vocabulary and thought processes. Can name several common items. No dysarthria. Poor fund of knowledge.  Cranial Nerves: II: Fixates and tracks normally with her left eye. PERRL  III,IV, VI: Squints frequently with right eye in the context of right esotropia. Can track to left and right fully with right eye intermittently, at other times right eye is adducted to the left. Left eye with normal motility.   V: Temp sensation equal bilaterally  VII: Smile symmetric VIII: Hearing intact to voice IX,X: No hoarseness XI: Symmetric shoulder shrug XII: Midline tongue extension Motor: BUE 5/5 proximally and distally BLE 5/5 proximally and distally  No pronator drift.  Sensory: Temp and light touch intact throughout, bilaterally. No extinction to DSS.  Deep Tendon Reflexes: 2+ and symmetric throughout Cerebellar: No ataxia with FNF bilaterally  Gait: Deferred   Labs/Imaging/Neurodiagnostic studies   CBC:  Recent Labs  Lab 08/22/2024 1659  WBC 8.2  NEUTROABS 7.0  HGB 13.5  HCT 40.0  MCV 90.1  PLT 300   Basic Metabolic  Panel:  Lab Results  Component Value Date   NA 133 (L) 08/22/2024   K 5.0 08-22-2024   CO2 22 Aug 22, 2024   GLUCOSE 135 (H) 2024/08/22   BUN <5 (L) 2024/08/22   CREATININE 0.69 August 22, 2024   CALCIUM 8.8 (L) 2024-08-22   GFRNONAA >60 August 22, 2024   Lipid Panel: No results found for: LDLCALC HgbA1c:  Lab Results  Component Value Date   HGBA1C 5.0 Aug 22, 2024   Urine Drug Screen: No results found for: LABOPIA, COCAINSCRNUR, LABBENZ, AMPHETMU, THCU, LABBARB  Alcohol Level     Component Value Date/Time   [facility] <15 2024-08-22 1659   INR  Lab Results  Component Value Date   INR 0.9 Aug 22, 2024   APTT  Lab Results  Component Value Date   APTT 28 08/22/24     ASSESSMENT  73 y.o. 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 Her sister also notes progressive weakness of her right eye, which has gradually been deviating more medially over the past month or so. Her ambulation has also worsened over several months, from a mildly decreased stride length to a shuffling gait. Although sister states she noticed this on Friday, Triage RN note documents facility and pt friend noticed R side facial droop and pt leaning to the right 3 days ago. The patient is not complaining of any headaches or any other new symptoms, in the context of her intellectual impairment. No seizures reported. Facility medications include Depakote  ER 500 mg every day, as well as donepezil  and memantine . - Exam reveals findings consistent with the patient's chronic intellectual disability. Squints  frequently with right eye in the context of right esotropia. Can track to left and right fully with right eye intermittently, at other times right eye is adducted to the left. Her left eye seems to be her dominant eye, with normal motility. No facial droop or focal limb weakness noted.  - Imaging: - 2-view X-rays of the skull: Stable intracranial ventriculostomy catheter, better evaluated on CT.  - X-ray of cervical spine, 1 view: No ventriculoperitoneal shunt catheter visualized along the neck or upper chest; refer to separate CT head report for intracranial catheter assessment. - CT head: Right parietal approach ventriculostomy catheter in similar position, terminating inferiorly at the foramen magnum. Marked ventriculomegaly, unchanged from prior. No acute intracranial abnormality. - CTA of head and neck: No acute intracranial hemorrhage or ischemic change. No emergent large vessel occlusion. Atherosclerosis of the left carotid bifurcation without hemodynamically significant stenosis. Normal right carotid system. Unchanged size and configuration of the enlarged shunted ventricles with right parietal approach shunt catheter terminating at the foramen magnum. - MRI brain: Truncated examination. Unchanged massive ventriculomegaly of the shunted ventricles and unchanged position of the right parietal approach shunt  catheter. - Impression: The patient's presenting symptoms of gradual intorsion of the right eye and gradually worsened shuffling gait over a period of months suggests possible shunt failure. Right CN VI compression is one explanation for her worsened ocular motility OD. Stroke has been ruled out with MRI.   RECOMMENDATIONS  - Call Neurosurgery for official consult and orders on appropriate imaging studies to evaluate her shunt for patency and possible kinking. - Neurology will follow PRN ______________________________________________________________________    Bonney SHARK, Threasa Kinch,  MD [facility]     [1] No Known Allergies [2]  Current Facility-Administered Medications:    [START ON 08/16/2024]  stroke: early stages of recovery book, , Does not apply, Once, Sundil, Subrina, MD   acetaminophen  (TYLENOL ) tablet 650 mg, 650 mg, Oral, Q4H PRN **OR** acetaminophen  (TYLENOL ) 160 MG/5ML solution 650 mg, 650 mg, Per Tube, Q4H PRN **OR** acetaminophen  (TYLENOL ) suppository 650 mg, 650 mg, Rectal, Q4H PRN, Sundil, Subrina, MD   [START ON 08/16/2024] aspirin  EC tablet 81 mg, 81 mg, Oral, Daily, Sundil, Subrina, MD   divalproex  (DEPAKOTE  ER) 24 hr tablet 500 mg, 500 mg, Oral, Daily, Sundil, Subrina, MD   donepezil  (ARICEPT ) tablet 10 mg, 10 mg, Oral, QHS, Sundil, Subrina, MD, 10 mg at 08/15/24 0225   enoxaparin  (LOVENOX ) injection 40 mg, 40 mg, Subcutaneous, Q24H, Sundil, Subrina, MD   famotidine  (PEPCID ) tablet 20 mg, 20 mg, Oral, Daily, Sundil, Subrina, MD   guaiFENesin  (MUCINEX ) 12 hr tablet 600 mg, 600 mg, Oral, BID, Sundil, Subrina, MD, 600 mg at 08/15/24 9762   levalbuterol  (XOPENEX ) nebulizer solution 0.63 mg, 0.63 mg, Nebulization, Q6H PRN, Sundil, Subrina, MD   LORazepam  (ATIVAN ) tablet 1 mg, 1 mg, Oral, PRN, Sundil, Subrina, MD   memantine  (NAMENDA  XR) 24 hr capsule 28 mg, 28 mg, Oral, Daily, Sundil, Subrina, MD   pravastatin  (PRAVACHOL ) tablet 40 mg, 40 mg, Oral, q1800, Sundil, Subrina, MD   senna-docusate (Senokot-S) tablet 1 tablet, 1 tablet, Oral, QHS PRN, Sundil, Subrina, MD  Current Outpatient Medications:    albuterol  (VENTOLIN  HFA) 108 (90 Base) MCG/ACT inhaler, Inhale 2 puffs into the lungs every 4 (four) hours as needed for wheezing or shortness of breath., Disp: 6.7 g, Rfl: 0

## 2024-08-15 NOTE — Progress Notes (Signed)
 Occupational Therapy Evaluation Patient Details Name: Angela Caldwell MRN: [id number] DOB: [birth year] Today's Date: 08/15/2024   History of Present Illness   73 y/o F presenting to ED on 12/12 from [facility] with R facial droop and headache x3 days, MRI with unchanged massive ventriculomegaly of shunted ventricles and unchanged position of R paerital Shunt catheter. CTA with no acute intracrainal change.Recentaly evaluated 12/4 for fall with L hip pain with no traumatic injury.    PMH includes intellectual disability, spina bifida     Clinical Impressions Pt from [facility], uses RW for mobility and reports ind with ADLs. Pt currently needs up to mod A for ADLs, min A for bed mobility and min A for transfers with RW, pt needs cues to keep RW close. Pt perseverative on RUE pain (from recent medication injection), labile during session, difficult to redirect. Pt presenting with impairments listed below, will follow acutely. Patient will benefit from continued inpatient follow up therapy, <3 hours/day to maximize safety/ind with ADL/functional mobility.      If plan is discharge home, recommend the following:   A little help with walking and/or transfers;A lot of help with bathing/dressing/bathroom;Assistance with cooking/housework;Assist for transportation;Supervision due to cognitive status;Help with stairs or ramp for entrance     Functional Status Assessment   Patient has had a recent decline in their functional status and demonstrates the ability to make significant improvements in function in a reasonable and predictable amount of time.     Equipment Recommendations   Other (comment) (defer)     Recommendations for Other Services   PT consult     Precautions/Restrictions   Precautions Precautions: Fall Recall of Precautions/Restrictions: Impaired Restrictions Weight Bearing Restrictions Per Provider Order: No     Mobility Bed Mobility Overal bed  mobility: Needs Assistance Bed Mobility: Supine to Sit, Sit to Supine       Sit to supine: Min assist        Transfers Overall transfer level: Needs assistance Equipment used: Rolling walker (2 wheels), 1 person hand held assist Transfers: Sit to/from Stand, Bed to chair/wheelchair/BSC Sit to Stand: Min assist                  Balance Overall balance assessment: Needs assistance Sitting-balance support: No upper extremity supported, Feet supported Sitting balance-Leahy Scale: Good     Standing balance support: Single extremity supported Standing balance-Leahy Scale: Poor Standing balance comment: reliant on RW                           ADL either performed or assessed with clinical judgement   ADL Overall ADL's : Needs assistance/impaired Eating/Feeding: Minimal assistance   Grooming: Minimal assistance   Upper Body Bathing: Minimal assistance   Lower Body Bathing: Moderate assistance   Upper Body Dressing : Minimal assistance   Lower Body Dressing: Moderate assistance   Toilet Transfer: Minimal assistance;Ambulation;Rolling walker (2 wheels)   Toileting- Clothing Manipulation and Hygiene: Minimal assistance       Functional mobility during ADLs: Minimal assistance;Rolling walker (2 wheels)       Vision   Additional Comments: R eye with esotropia     Perception Perception: Not tested       Praxis Praxis: Not tested       Pertinent Vitals/Pain Pain Assessment Pain Assessment: Faces Pain Score: 4  Faces Pain Scale: Hurts little more Pain Location: RUE with injection from RN Pain Descriptors / Indicators:  Aching Pain Intervention(s): Limited activity within patient's tolerance, Monitored during session, Repositioned     Extremity/Trunk Assessment Upper Extremity Assessment Upper Extremity Assessment: Generalized weakness   Lower Extremity Assessment Lower Extremity Assessment: Defer to PT evaluation   Cervical / Trunk  Assessment Cervical / Trunk Assessment: Normal   Communication Communication Communication: No apparent difficulties   Cognition Arousal: Alert Behavior During Therapy: WFL for tasks assessed/performed Cognition: History of cognitive impairments             OT - Cognition Comments: hx ID                 Following commands: Impaired Following commands impaired: Follows one step commands inconsistently     Cueing  General Comments   Cueing Techniques: Verbal cues;Tactile cues;Gestural cues  VSS on RA   Exercises     Shoulder Instructions      Home Living Family/patient expects to be discharged to:: Skilled nursing facility                                 Additional Comments: Pt reports living at [facility]      Prior Functioning/Environment Prior Level of Function : Needs assist;History of Falls (last six months)             Mobility Comments: Reports using RW for mobility. Reports hx of falls. ADLs Comments: States she can bath and dress herself, walks to dining hall at living facility    OT Problem List: Decreased strength;Decreased activity tolerance;Decreased range of motion;Impaired balance (sitting and/or standing)   OT Treatment/Interventions: Self-care/ADL training;Therapeutic exercise;Energy conservation;DME and/or AE instruction;Therapeutic activities;Balance training;Patient/family education      OT Goals(Current goals can be found in the care plan section)   Acute Rehab OT Goals Patient Stated Goal: did not state OT Goal Formulation: With patient Time For Goal Achievement: 08/29/24 Potential to Achieve Goals: Good   OT Frequency:  Min 2X/week    Co-evaluation              AM-PAC OT 6 Clicks Daily Activity     Outcome Measure Help from another person eating meals?: A Little Help from another person taking care of personal grooming?: A Little Help from another person toileting, which includes using toliet,  bedpan, or urinal?: A Little Help from another person bathing (including washing, rinsing, drying)?: A Lot Help from another person to put on and taking off regular upper body clothing?: A Little Help from another person to put on and taking off regular lower body clothing?: A Lot 6 Click Score: 16   End of Session Equipment Utilized During Treatment: Rolling walker (2 wheels) Nurse Communication: Mobility status  Activity Tolerance: Patient tolerated treatment well Patient left: in bed;with call bell/phone within reach  OT Visit Diagnosis: Unsteadiness on feet (R26.81);Other abnormalities of gait and mobility (R26.89);Muscle weakness (generalized) (M62.81)                Time: 9043-8987 OT Time Calculation (min): 16 min Charges:  OT General Charges $OT Visit: 1 Visit OT Evaluation $OT Eval Low Complexity: 1 Low  Claudia Alvizo K, OTD, OTR/L SecureChat Preferred Acute Rehab [phone]   Emeric Novinger K Koonce 08/15/2024, 3:52 PM

## 2024-08-15 NOTE — TOC Transition Note (Addendum)
 Transition of Care [facility]) - Discharge Note   Patient Details  Name: Osie Merkin MRN: [id number] Date of Birth: Feb 01, 1951  Transition [facility] CM/SW Contact:  Hartley KATHEE Robertson, LCSWA Phone Number: 08/15/2024, 2:45 PM   Clinical Narrative:     [facility] will [facility] to: [facility] Anticipated DC date: 08/15/24 Family notified: sister [name] Transport by: private vehicle-Brenda Sharps    Per MD patient ready for DC to [facility]. Christie at [facility] confirms pt can return today. RN to call report prior to discharge [phone]. RN, patient, patient's family, and facility notified of DC. Pt's sister will transport.   CSW will sign off for now as social work intervention is no longer needed. Please consult us  again if new needs arise.           Patient Goals and CMS Choice            Discharge Placement                       Discharge Plan and Services Additional resources added to the After Visit Summary for                                       Social Drivers of Health (SDOH) Interventions SDOH Screenings   Food Insecurity: Low Risk (06/19/2024)   Received from [facility]  Housing: Low Risk (06/19/2024)   Received from [facility]  Transportation Needs: No Transportation Needs (07/13/2024)   Received from [facility]  Utilities: Low Risk (06/19/2024)   Received from [facility]  Social Connections: Unknown (01/16/2022)   Received from [facility]  Tobacco Use: Low Risk (08/15/2024)     Readmission Risk Interventions     No data to display

## 2024-08-15 NOTE — Progress Notes (Incomplete)
 Subjective: Patient admitted this morning, see detailed H&P by 73 y.o. female with medical history significant of spina bifida, memory impairment, seizure-like activity, chronic gait difficulty, chronic headache, intellectual disability, communicating hydrocephalus with VP shunt presented to emergency department via EMS from [facility] for evaluation for right-sided facial droop, leaning towards the right side when ambulating when her sister came to visit her today around 2:30 PM in the afternoon.  Staff from [facility] reported that she was complaining about headache for last 24 hours  CT head findings following  1. Right parietal approach ventriculostomy catheter in similar position, terminating inferiorly at the foramen magnum. 2. Marked ventriculomegaly, unchanged from prior. 3. No acute intracranial abnormality.   MRI of the brain showed:Truncated examination. 2. Unchanged massive ventriculomegaly of the shunted ventricles and unchanged position of the right parietal approach shunt catheter.     ED physician discussed case with neurosurgery Dr. Gillie who reviewed the lab and imaging.  Reported that shunt in proper place and ventriculomegaly stable.  EDP also consulted and discussed case with neurology Dr.Lindzen recommended admit patient for TIA workup.  Vitals:   08/15/24 0730 08/15/24 0800  BP:    Pulse: 67 67  Resp: 15 20  Temp:    SpO2: 96% 94%      A/P Right-sided facial droop-resolved Right-sided eyelid droop Concern for TIA History of unsteady gait History of hydrocephalus with VP shunt (follows neurosurgery with [facility]) History of spina bifida -Presented to emergency department as patient's sister noticed that she is loading more towards left side and have some facial droop reported by nursing home.  At presentation to ED patient does not have any upper or lower extremity sensory deficient and at baseline however noticed to have slight right-sided eyelid  droop. -At presentation to ED patient is hemodynamically stable. Lab, CBC unremarkable.  CMP showing sodium 133 which is at baseline, otherwise unremarkable.  Normal blood alcohol level.  UA no evidence of UTI.  Normal APTT, pro time and INR. - X-ray cervical spine no evidence ofventriculoperitoneal shunt catheter visualized along the neck or upper chest; refer to separate CT head report for intracranial catheter assessment. - X-rays: Stable intracranial ventriculostomy catheter, better evaluated on CT . - CT head findings following  1. Right parietal approach ventriculostomy catheter in similar position, terminating inferiorly at the foramen magnum. 2. Marked ventriculomegaly, unchanged from prior. 3. No acute intracranial abnormality. - MRI of the brain showed:Truncated examination. 2. Unchanged massive ventriculomegaly of the shunted ventricles and unchanged position of the right parietal approach shunt catheter. - ED physician discussed case with neurosurgery Dr. Gillie who reviewed the lab and imaging.  Reported that shunt in proper place and ventriculomegaly stable.  EDP also consulted and discussed case with neurology Dr.Lindzen recommended admit patient for TIA workup. -Obtaining CT angio head and neck. -Continue neurocheck every 6 hours. - Check A1c lipid panel. -Giving aspirin  325mg  followed by continue home aspirin  81 mg daily and Lipitor at 40 mg daily. -Obtain echocardiogram. - Continue cardiac monitoring. -Consulted PT and OT.  Obtain stroke swallow screen followed by continue diet and oral medications if patient does not shows any evidence of dysphagia. -TIA/stroke order set has been utilized.     History of seizure-like activity -Continue Depakote  ER 500 mg daily   Intellectual disability History of unsteady gait - Continue fall precaution.   Chronic headache -Continue Tylenol  as needed   History of memory impairment Generalized anxiety disorder - Continue Aricept   10 mg daily, and  memantine  28 mg daily      Sabas GORMAN Brod [facility]

## 2024-08-15 NOTE — Discharge Summary (Signed)
 Physician Discharge Summary   Patient: Angela Caldwell MRN: [id number] DOB: 07-27-1951  Admit date:     08/14/2024  Discharge date: 08/15/2024  Discharge Physician: Sabas GORMAN Brod   PCP: Pcp, No   Recommendations at discharge:   Follow-up neurosurgeon as outpatient She would benefit from physical therapy at skilled facility  Discharge Diagnoses: Principal Problem:   Drooping eyelid, right Active Problems:   Communicating hydrocephalus (HCC)   Memory impairment   Intellectual disability   History of unsteady gait   Spina bifida (HCC)   Chronic headache   History of seizure   S/P VP shunt  Resolved Problems:   TIA (transient ischemic attack)  Hospital Course: 73 y.o. female with medical history significant of spina bifida, memory impairment, seizure-like activity, chronic gait difficulty, chronic headache, intellectual disability, communicating hydrocephalus with VP shunt presented to emergency department via EMS from [facility] for evaluation for right-sided facial droop, leaning towards the right side when ambulating when her sister came to visit her today around 2:30 PM in the afternoon.  Staff from [facility] reported that she was complaining about headache for last 24 hours  CT head findings following  1. Right parietal approach ventriculostomy catheter in similar position, terminating inferiorly at the foramen magnum. 2. Marked ventriculomegaly, unchanged from prior. 3. No acute intracranial abnormality.   MRI of the brain showed:Truncated examination. 2. Unchanged massive ventriculomegaly of the shunted ventricles and unchanged position of the right parietal approach shunt catheter.  Assessment and Plan: Right-sided facial droop-resolved Right-sided eyelid droop Concern for TIA History of unsteady gait History of hydrocephalus with VP shunt (follows neurosurgery with [facility]) History of spina bifida  Came with right-sided facial droop, resolved Gradual  introduction of right eye, chronic.  Risk Patient had CT of the head, MRI brain which showed ventriculomegaly, ventriculostomy catheter in place. Neurosurgery was consulted, Dr. Gillie evaluated the images He does not feel that patient needs any intervention. No stroke seen on MRI Recommend to follow-up with her own neurosurgeon at [facility]     History of seizure-like activity -Continue Depakote  ER 500 mg daily   Intellectual disability History of unsteady gait - Continue fall precaution. Continue physical therapy at the facility   Chronic headache -Continue Tylenol  as needed   History of memory impairment Generalized anxiety disorder - Continue Aricept  10 mg daily, and memantine  28 mg daily         Consultants: Neurosurgery Procedures performed:  Disposition: Skilled nursing facility Diet recommendation:  Regular diet DISCHARGE MEDICATION: Allergies as of 08/15/2024   No Known Allergies      Medication List     STOP taking these medications    predniSONE  20 MG tablet Commonly known as: DELTASONE        TAKE these medications    acetaminophen  650 MG CR tablet Commonly known as: TYLENOL  Take 650 mg by mouth every 6 (six) hours as needed for pain or fever.   albuterol  108 (90 Base) MCG/ACT inhaler Commonly known as: VENTOLIN  HFA Inhale 2 puffs into the lungs every 4 (four) hours as needed for wheezing or shortness of breath.   aspirin  EC 81 MG tablet Take 81 mg by mouth daily.   cetirizine 10 MG tablet Commonly known as: ZYRTEC Take 10 mg by mouth daily.   chlorhexidine 0.12 % solution Commonly known as: PERIDEX Use as directed 30 mLs in the mouth or throat at bedtime.   citalopram 20 MG tablet Commonly known as: CELEXA Take 20 mg by mouth  daily.   divalproex  250 MG 24 hr tablet Commonly known as: DEPAKOTE  ER Take 500 mg by mouth in the morning and at bedtime.   donepezil  10 MG tablet Commonly known as: ARICEPT  Take 10 mg by  mouth at bedtime.   famotidine  20 MG tablet Commonly known as: PEPCID  Take 20 mg by mouth at bedtime.   guaifenesin  100 MG/5ML syrup Commonly known as: ROBITUSSIN Take 200 mg by mouth every 4 (four) hours as needed for cough.   hydrOXYzine 25 MG tablet Commonly known as: ATARAX Take 25 mg by mouth every 8 (eight) hours as needed for itching or anxiety (cough).   memantine  10 MG tablet Commonly known as: NAMENDA  Take 10 mg by mouth daily.   Midazolam  5 MG/0.1ML Soln Place 5 mg into the nose as needed (seizure).   omeprazole 20 MG capsule Commonly known as: PRILOSEC Take 40 mg by mouth daily.   primidone 50 MG tablet Commonly known as: MYSOLINE Take 50 mg by mouth in the morning and at bedtime.   rosuvastatin 5 MG tablet Commonly known as: CRESTOR Take 5 mg by mouth daily.   senna-docusate 8.6-50 MG tablet Commonly known as: Senokot-S Take 2 tablets by mouth at bedtime.   sennosides-docusate sodium  8.6-50 MG tablet Commonly known as: SENOKOT-S Take 2 tablets by mouth 2 (two) times daily as needed for constipation.   Vitamin D (Ergocalciferol) 1.25 MG (50000 UNIT) Caps capsule Commonly known as: DRISDOL Take 50,000 Units by mouth every 7 (seven) days. Friday        Discharge Exam: There were no vitals filed for this visit. Appears in no acute distress Alert, orient x 3 Lungs are clear to auscultation bilaterally  Condition at discharge: good  The results of significant diagnostics from this hospitalization (including imaging, microbiology, ancillary and laboratory) are listed below for reference.   Imaging Studies: CT ANGIO HEAD NECK W WO CM Result Date: 08/15/2024 EXAM: CTA Head and Neck with Intravenous Contrast. CT Head without Contrast. CLINICAL HISTORY: Concern for transient ischemic attack. TECHNIQUE: Axial CTA images of the head and neck performed with intravenous contrast. MIP reconstructed images were created and reviewed. Axial computed tomography  images of the head/brain performed without intravenous contrast. Note: Per PQRS, the description of internal carotid artery percent stenosis, including 0 percent or normal exam, is based on North American Symptomatic Carotid Endarterectomy Trial (NASCET) criteria. Dose reduction technique was used including one or more of the following: automated exposure control, adjustment of mA and kV according to patient size, and/or iterative reconstruction. CONTRAST: With and without IV contrast. 75 mL (iohexol  (OMNIPAQUE ) 350 MG/ML injection 75 mL IOHEXOL  350 MG/ML SOLN). COMPARISON: None provided. FINDINGS: CT HEAD: BRAIN: No acute intraparenchymal hemorrhage. No mass lesion. No CT evidence for acute territorial infarct. No midline shift or extra-axial collection. VENTRICLES: Unchanged size and configuration of the enlarged shunted ventricles. Right parietal approach shunt catheter. The shunt catheter terminates at the foramen magnum. ORBITS: The orbits are unremarkable. SINUSES AND MASTOIDS: The paranasal sinuses and mastoid air cells are clear. CTA NECK: COMMON CAROTID ARTERIES: Left: Atherosclerosis at the bifurcation with no hemodynamically significant stenosis. Right: Common Carotid Artery: Normal. INTERNAL CAROTID ARTERIES: Left: Atherosclerosis at the bifurcation with no hemodynamically significant stenosis. Right: Internal Carotid Artery: Normal. VERTEBRAL ARTERIES: Left: No significant stenosis. No dissection or occlusion. Right: Vertebral Artery: Normal. CTA HEAD: ANTERIOR CEREBRAL ARTERIES: No significant stenosis. No occlusion. No aneurysm. MIDDLE CEREBRAL ARTERIES: No significant stenosis. No occlusion. No aneurysm. POSTERIOR CEREBRAL ARTERIES: No  significant stenosis. No occlusion. No aneurysm. BASILAR ARTERY: No significant stenosis. No occlusion. No aneurysm. OTHER: SOFT TISSUES: No acute finding. No masses or lymphadenopathy. BONES: No acute osseous abnormality. IMPRESSION: 1. No acute intracranial  hemorrhage or ischemic change. 2. No emergent large vessel occlusion. 3. Atherosclerosis of the left carotid bifurcation without hemodynamically significant stenosis. Normal right carotid system. 4. Unchanged size and configuration of the enlarged shunted ventricles with right parietal approach shunt catheter terminating at the foramen magnum. Electronically signed by: Franky Stanford MD 08/15/2024 01:37 AM EST RP Workstation: [id number]   MR BRAIN WO CONTRAST Result Date: 08/15/2024 EXAM: MRI BRAIN WITHOUT CONTRAST 08/14/2024 11:59:59 PM TECHNIQUE: Multiplanar multisequence MRI of the head/brain was performed without the administration of intravenous contrast. Truncated examination. Only diffusion and susceptibility weighted imaging was performed. COMPARISON: Head CT 08/14/2024 and 08/06/2024. CLINICAL HISTORY: Neuro deficit, acute, stroke suspected. FINDINGS: BRAIN AND VENTRICLES: No acute infarct. No intracranial hemorrhage. No mass. No midline shift. Unchanged massive ventriculomegaly of the shunted ventricles. Unchanged position of right parietal approach shunt catheter. The sella is unremarkable. Normal flow voids. ORBITS: No acute abnormality. SINUSES AND MASTOIDS: No acute abnormality. BONES AND SOFT TISSUES: Normal marrow signal. No acute soft tissue abnormality. IMPRESSION: 1. Truncated examination. 2. Unchanged massive ventriculomegaly of the shunted ventricles and unchanged position of the right parietal approach shunt catheter. Electronically signed by: Franky Stanford MD 08/15/2024 12:11 AM EST RP Workstation: [id number]   DG Skull 1-3 Views Result Date: 08/14/2024 EXAM: 2 VIEW(S) XRAY OF THE SKULL 08/14/2024 04:53:00 PM COMPARISON: None available. CLINICAL HISTORY: evaluate for shunt malfunction FINDINGS: BONES: Stable intracranial ventriculostomy catheter, better evaluated on CT. No acute fracture. SINUSES: Unremarkable. SOFT TISSUES: Unremarkable. IMPRESSION: 1. Stable intracranial ventriculostomy  catheter, better evaluated on CT. Electronically signed by: Pinkie Pebbles MD 08/14/2024 05:58 PM EST RP Workstation: [id number]   CT Head Wo Contrast Result Date: 08/14/2024 EXAM: CT HEAD WITHOUT 08/14/2024 05:35:00 PM TECHNIQUE: CT of the head was performed without the administration of intravenous contrast. Automated exposure control, iterative reconstruction, and/or weight based adjustment of the mA/kV was utilized to reduce the radiation dose to as low as reasonably achievable. COMPARISON: 08/06/2024 CLINICAL HISTORY: Neuro deficit, acute, stroke suspected FINDINGS: BRAIN AND VENTRICLES: Right parietal approach ventriculostomy catheter in similar position, terminating inferiorly at the foramen magnum. Marked ventriculomegaly, unchanged from priors. No acute intracranial hemorrhage. No mass effect or midline shift. No extra-axial fluid collection. No evidence of acute infarct. ORBITS: No acute abnormality. SINUSES AND MASTOIDS: Partial opacification of the bilateral frontal and ethmoid sinuses. No acute abnormality of the mastoids. SOFT TISSUES AND SKULL: No acute skull fracture. No acute soft tissue abnormality. IMPRESSION: 1. Right parietal approach ventriculostomy catheter in similar position, terminating inferiorly at the foramen magnum. 2. Marked ventriculomegaly, unchanged from prior. 3. No acute intracranial abnormality. Electronically signed by: Pinkie Pebbles MD 08/14/2024 05:57 PM EST RP Workstation: [id number]   DG Cervical Spine 1 View Result Date: 08/14/2024 EXAM: 1 VIEW XRAY OF THE CERVICAL SPINE 08/14/2024 04:53:00 PM COMPARISON: None available. CLINICAL HISTORY: Headache. FINDINGS: BONES: Vertebral body heights are maintained. Alignment is normal. DISCS AND DEGENERATIVE CHANGES: No severe degenerative changes. SOFT TISSUES: No prevertebral soft tissue swelling. No VP shunt catheter is visualized along the neck or upper chest. Correlate with separate CT head report. The visualized  lungs appear clear. IMPRESSION: 1. No ventriculoperitoneal shunt catheter visualized along the neck or upper chest; refer to separate CT head report for intracranial catheter assessment. Electronically signed by: Pinkie Pebbles MD 08/14/2024  05:54 PM EST RP Workstation: [id number]   CT Head Wo Contrast Result Date: 08/06/2024 EXAM: CT HEAD WITHOUT CONTRAST 08/06/2024 09:32:01 AM TECHNIQUE: CT of the head was performed without the administration of intravenous contrast. Automated exposure control, iterative reconstruction, and/or weight based adjustment of the mA/kV was utilized to reduce the radiation dose to as low as reasonably achievable. COMPARISON: 04/23/2024 CLINICAL HISTORY: Head trauma, minor (Age >= 65y) FINDINGS: BRAIN AND VENTRICLES: 2 shunts remain in place, 1 in the posterior right lateral ventricle and the second at the foramen magnum. Continued ventriculomegaly, stable since prior study. No acute hemorrhage. No evidence of acute infarct. No extra-axial collection. No mass effect or midline shift. ORBITS: No acute abnormality. SINUSES: No acute abnormality. SOFT TISSUES AND SKULL: No acute soft tissue abnormality. No skull fracture. IMPRESSION: 1. No acute intracranial abnormality. 2. Stable ventriculomegaly. Electronically signed by: Franky Crease MD 08/06/2024 09:58 AM EST RP Workstation: [id number]   CT Cervical Spine Wo Contrast Result Date: 08/06/2024 CLINICAL DATA:  Neck trauma. EXAM: CT CERVICAL SPINE WITHOUT CONTRAST TECHNIQUE: Multidetector CT imaging of the cervical spine was performed without intravenous contrast. Multiplanar CT image reconstructions were also generated. RADIATION DOSE REDUCTION: This exam was performed according to the departmental dose-optimization program which includes automated exposure control, adjustment of the mA and/or kV according to patient size and/or use of iterative reconstruction technique. COMPARISON:  CT cervical spine 02/11/2023. FINDINGS: Technical  note: Despite efforts by the technologist and patient, moderate motion artifact is present on today's exam and could not be eliminated. This reduces exam sensitivity and specificity. Alignment: Straightening without focal angulation or listhesis. 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 Allowing for the motion limitations of this examination, there is no evidence of acute fracture or traumatic subluxation. Interfacetal ankylosis is present bilaterally from C3 through C5. Soft tissues and spinal canal: No prevertebral fluid or swelling. No visible canal hematoma. Disc levels: Similar multilevel spondylosis with disc space narrowing and uncinate spurring. No large disc herniation or high-grade spinal stenosis demonstrated. Upper chest: Clear lung apices. Other: Intracranial findings dictated separately. IMPRESSION: 1. This examination is moderately motion degraded. If sufficient clinical concern for acute injury, repeat follow-up study should be considered. 2. No evidence of acute cervical spine fracture, traumatic subluxation or static signs of instability. 3. 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 4. Similar multilevel cervical spondylosis. Electronically Signed   By: Elsie Perone M.D.   On: 08/06/2024 09:52   DG Pelvis 1-2 Views Result Date: 08/06/2024 CLINICAL DATA:  Fall. EXAM: PELVIS - 1-2 VIEW COMPARISON:  02/11/2023 FINDINGS: Minimal symmetric osteoarthritis of the hips. No acute fracture or dislocation. Degenerative change of the spine and sacroiliac joints. Remainder of the exam is unchanged. IMPRESSION: 1. No acute findings. 2. Minimal symmetric osteoarthritis of the hips. Electronically Signed   By: Toribio Agreste M.D.   On: 08/06/2024 08:13   DG FEMUR MIN 2 VIEWS LEFT Result Date: 08/06/2024 CLINICAL DATA:  Fall. EXAM: LEFT FEMUR 2 VIEWS COMPARISON:  12/23/2020 FINDINGS: There is no  evidence of fracture or other focal bone lesions. Soft tissues are unremarkable. IMPRESSION: Negative. Electronically Signed   By: Toribio Agreste M.D.   On: 08/06/2024 08:11   DG Chest 1 View Result Date: 08/06/2024 CLINICAL DATA:  Fall.  Cough. EXAM: CHEST  1 VIEW COMPARISON:  None Available. FINDINGS: Lungs are hypoinflated without focal airspace consolidation, effusion or pneumothorax. Cardiomediastinal silhouette is normal. Degenerative  change of the spine. No acute fracture. IMPRESSION: Hypoinflation without acute cardiopulmonary disease. Electronically Signed   By: Toribio Agreste M.D.   On: 08/06/2024 08:11    Microbiology: Results for orders placed or performed during the hospital encounter of 08/06/24  Resp panel by RT-PCR (RSV, Flu A&B, Covid) Anterior Nasal Swab     Status: None   Collection Time: 08/06/24  7:05 AM   Specimen: Anterior Nasal Swab  Result Value Ref Range Status   SARS Coronavirus 2 by RT PCR NEGATIVE NEGATIVE Final   Influenza A by PCR NEGATIVE NEGATIVE Final   Influenza B by PCR NEGATIVE NEGATIVE Final    Comment: (NOTE) The Xpert Xpress SARS-CoV-2/FLU/RSV plus assay is intended as an aid in the diagnosis of influenza from Nasopharyngeal swab specimens and should not be used as a sole basis for treatment. Nasal washings and aspirates are unacceptable for Xpert Xpress SARS-CoV-2/FLU/RSV testing.  Fact Sheet for Patients: bloggercourse.com  Fact Sheet for Healthcare Providers: seriousbroker.it  This test is not yet approved or cleared by the [place]  FDA and has been authorized for detection and/or diagnosis of SARS-CoV-2 by FDA under an Emergency Use Authorization (EUA). This EUA will remain in effect (meaning this test can be used) for the duration of the COVID-19 declaration under Section 564(b)(1) of the Act, 21 U.S.C. section 360bbb-3(b)(1), unless the authorization is terminated or revoked.      Resp Syncytial Virus by PCR NEGATIVE NEGATIVE Final    Comment: (NOTE) Fact Sheet for Patients: bloggercourse.com  Fact Sheet for Healthcare Providers: seriousbroker.it  This test is not yet approved or cleared by the [place]  FDA and has been authorized for detection and/or diagnosis of SARS-CoV-2 by FDA under an Emergency Use Authorization (EUA). This EUA will remain in effect (meaning this test can be used) for the duration of the COVID-19 declaration under Section 564(b)(1) of the Act, 21 U.S.C. section 360bbb-3(b)(1), unless the authorization is terminated or revoked.  Performed at [facility], [address] [place], [place] [address]     Labs: CBC: Recent Labs  Lab 08/14/24 1659 08/15/24 0500  WBC 8.2 11.0*  NEUTROABS 7.0  --   HGB 13.5 12.7  HCT 40.0 37.4  MCV 90.1 91.7  PLT 300 280   Basic Metabolic Panel: Recent Labs  Lab 08/14/24 1659 08/15/24 0500  NA 133* 133*  K 5.0 3.7  CL 99 101  CO2 22 23  GLUCOSE 135* 134*  BUN <5* 5*  CREATININE 0.69 0.73  CALCIUM 8.8* 8.4*   Liver Function Tests: Recent Labs  Lab 08/14/24 1659 08/15/24 0500  AST 19 12*  ALT 10 8  ALKPHOS 71 65  BILITOT 0.9 0.3  PROT 6.4* 5.5*  ALBUMIN 3.1* 2.6*   CBG: Recent Labs  Lab 08/14/24 1603  GLUCAP 142*    Discharge time spent: greater than 30 minutes.  Signed: Sabas GORMAN Brod, MD [facility] 08/15/2024

## 2024-09-18 ENCOUNTER — Emergency Department (HOSPITAL_COMMUNITY)

## 2024-09-18 ENCOUNTER — Observation Stay (HOSPITAL_COMMUNITY)
Admission: EM | Admit: 2024-09-18 | Discharge: 2024-09-21 | Disposition: A | Discharge status: Skilled Nursing Facility | Attending: Family Medicine | Admitting: Family Medicine

## 2024-09-18 ENCOUNTER — Other Ambulatory Visit: Payer: Self-pay

## 2024-09-18 DIAGNOSIS — K5900 Constipation, unspecified: Secondary | ICD-10-CM | POA: Insufficient documentation

## 2024-09-18 DIAGNOSIS — R251 Tremor, unspecified: Secondary | ICD-10-CM | POA: Diagnosis not present

## 2024-09-18 DIAGNOSIS — Y92002 Bathroom of unspecified non-institutional (private) residence single-family (private) house as the place of occurrence of the external cause: Secondary | ICD-10-CM | POA: Diagnosis not present

## 2024-09-18 DIAGNOSIS — Z79899 Other long term (current) drug therapy: Secondary | ICD-10-CM | POA: Insufficient documentation

## 2024-09-18 DIAGNOSIS — E871 Hypo-osmolality and hyponatremia: Secondary | ICD-10-CM | POA: Diagnosis not present

## 2024-09-18 DIAGNOSIS — G40909 Epilepsy, unspecified, not intractable, without status epilepticus: Secondary | ICD-10-CM | POA: Diagnosis not present

## 2024-09-18 DIAGNOSIS — K219 Gastro-esophageal reflux disease without esophagitis: Secondary | ICD-10-CM | POA: Diagnosis not present

## 2024-09-18 DIAGNOSIS — M79604 Pain in right leg: Secondary | ICD-10-CM

## 2024-09-18 DIAGNOSIS — S7001XA Contusion of right hip, initial encounter: Secondary | ICD-10-CM | POA: Diagnosis not present

## 2024-09-18 DIAGNOSIS — Z7982 Long term (current) use of aspirin: Secondary | ICD-10-CM | POA: Insufficient documentation

## 2024-09-18 DIAGNOSIS — F79 Unspecified intellectual disabilities: Secondary | ICD-10-CM | POA: Diagnosis not present

## 2024-09-18 DIAGNOSIS — R531 Weakness: Secondary | ICD-10-CM | POA: Diagnosis not present

## 2024-09-18 DIAGNOSIS — F419 Anxiety disorder, unspecified: Secondary | ICD-10-CM | POA: Diagnosis not present

## 2024-09-18 DIAGNOSIS — Z789 Other specified health status: Secondary | ICD-10-CM

## 2024-09-18 DIAGNOSIS — E785 Hyperlipidemia, unspecified: Secondary | ICD-10-CM | POA: Insufficient documentation

## 2024-09-18 DIAGNOSIS — W19XXXA Unspecified fall, initial encounter: Principal | ICD-10-CM | POA: Diagnosis present

## 2024-09-18 DIAGNOSIS — I517 Cardiomegaly: Secondary | ICD-10-CM | POA: Diagnosis not present

## 2024-09-18 DIAGNOSIS — R262 Difficulty in walking, not elsewhere classified: Secondary | ICD-10-CM | POA: Diagnosis not present

## 2024-09-18 DIAGNOSIS — M79661 Pain in right lower leg: Secondary | ICD-10-CM | POA: Diagnosis present

## 2024-09-18 LAB — CBC WITH DIFFERENTIAL/PLATELET
Abs Immature Granulocytes: 0.04 K/uL (ref 0.00–0.07)
Basophils Absolute: 0.1 K/uL (ref 0.0–0.1)
Basophils Relative: 1 %
Eosinophils Absolute: 0.2 K/uL (ref 0.0–0.5)
Eosinophils Relative: 2 %
HCT: 41.6 % (ref 36.0–46.0)
Hemoglobin: 14.7 g/dL (ref 12.0–15.0)
Immature Granulocytes: 0 %
Lymphocytes Relative: 20 %
Lymphs Abs: 1.8 K/uL (ref 0.7–4.0)
MCH: 30.9 pg (ref 26.0–34.0)
MCHC: 35.3 g/dL (ref 30.0–36.0)
MCV: 87.6 fL (ref 80.0–100.0)
Monocytes Absolute: 0.8 K/uL (ref 0.1–1.0)
Monocytes Relative: 9 %
Neutro Abs: 6.1 K/uL (ref 1.7–7.7)
Neutrophils Relative %: 68 %
Platelets: 247 K/uL (ref 150–400)
RBC: 4.75 MIL/uL (ref 3.87–5.11)
RDW: 12.3 % (ref 11.5–15.5)
WBC: 9.1 K/uL (ref 4.0–10.5)
nRBC: 0 % (ref 0.0–0.2)

## 2024-09-18 LAB — URINALYSIS, ROUTINE W REFLEX MICROSCOPIC
Bilirubin Urine: NEGATIVE
Glucose, UA: NEGATIVE mg/dL
Ketones, ur: NEGATIVE mg/dL
Leukocytes,Ua: NEGATIVE
Nitrite: NEGATIVE
Protein, ur: NEGATIVE mg/dL
Specific Gravity, Urine: 1.015 (ref 1.005–1.030)
pH: 7.5 (ref 5.0–8.0)

## 2024-09-18 LAB — BASIC METABOLIC PANEL WITH GFR
Anion gap: 10 (ref 5–15)
Anion gap: 9 (ref 5–15)
BUN: <5 mg/dL — ABNORMAL LOW (ref 8–23)
BUN: 11 mg/dL (ref 8–23)
CO2: 25 mmol/L (ref 22–32)
CO2: 24 mmol/L (ref 22–32)
Calcium: 9.2 mg/dL (ref 8.9–10.3)
Calcium: 8.4 mg/dL — ABNORMAL LOW (ref 8.9–10.3)
Chloride: 92 mmol/L — ABNORMAL LOW (ref 98–111)
Chloride: 98 mmol/L (ref 98–111)
Creatinine, Ser: 0.7 mg/dL (ref 0.44–1.00)
Creatinine, Ser: 0.64 mg/dL (ref 0.44–1.00)
GFR, Estimated: >60 mL/min
GFR, Estimated: >60 mL/min
Glucose, Bld: 92 mg/dL (ref 70–99)
Glucose, Bld: 166 mg/dL — ABNORMAL HIGH (ref 70–99)
Potassium: 4.7 mmol/L (ref 3.5–5.1)
Potassium: 4.5 mmol/L (ref 3.5–5.1)
Sodium: 127 mmol/L — ABNORMAL LOW (ref 135–145)
Sodium: 131 mmol/L — ABNORMAL LOW (ref 135–145)

## 2024-09-18 LAB — URINALYSIS, MICROSCOPIC (REFLEX): WBC, UA: NONE SEEN WBC/hpf (ref 0–5)

## 2024-09-18 MED ORDER — SENNA-DOCUSATE SODIUM 8.6-50 MG PO TABS: 2.0000 | ORAL_TABLET | Freq: Two times a day (BID) | ORAL | Status: DC | PRN

## 2024-09-18 MED ORDER — LORATADINE 10 MG PO TABS
10.0000 mg | ORAL_TABLET | Freq: Every day | ORAL | Status: DC
  Administered 2024-09-18 – 2024-09-21 (×4): 10 mg via ORAL
  Filled 2024-09-18 (×4): qty 1

## 2024-09-18 MED ORDER — OXYCODONE-ACETAMINOPHEN 5-325 MG PO TABS
1.0000 | ORAL_TABLET | Freq: Once | ORAL | Status: AC
  Administered 2024-09-18: 1 via ORAL
  Filled 2024-09-18: qty 1

## 2024-09-18 MED ORDER — ENOXAPARIN SODIUM 40 MG/0.4ML IJ SOSY
40.0000 mg | PREFILLED_SYRINGE | INTRAMUSCULAR | Status: DC
  Administered 2024-09-18 – 2024-09-21 (×4): 40 mg via SUBCUTANEOUS
  Filled 2024-09-18 (×4): qty 0.4

## 2024-09-18 MED ORDER — ONDANSETRON HCL 4 MG/2ML IJ SOLN
4.0000 mg | Freq: Once | INTRAMUSCULAR | Status: AC
  Administered 2024-09-18: 4 mg via INTRAVENOUS
  Filled 2024-09-18: qty 2

## 2024-09-18 MED ORDER — MEMANTINE HCL 10 MG PO TABS
10.0000 mg | ORAL_TABLET | Freq: Every day | ORAL | Status: DC
  Administered 2024-09-18 – 2024-09-21 (×4): 10 mg via ORAL
  Filled 2024-09-18 (×4): qty 1

## 2024-09-18 MED ORDER — PANTOPRAZOLE SODIUM 40 MG PO TBEC
40.0000 mg | DELAYED_RELEASE_TABLET | Freq: Every day | ORAL | Status: DC
  Administered 2024-09-18 – 2024-09-21 (×4): 40 mg via ORAL
  Filled 2024-09-18 (×4): qty 1

## 2024-09-18 MED ORDER — FAMOTIDINE 20 MG PO TABS
20.0000 mg | ORAL_TABLET | Freq: Every day | ORAL | Status: DC
  Administered 2024-09-18 – 2024-09-20 (×3): 20 mg via ORAL
  Filled 2024-09-18 (×3): qty 1

## 2024-09-18 MED ORDER — OXYCODONE HCL 5 MG PO TABS
5.0000 mg | ORAL_TABLET | Freq: Once | ORAL | Status: AC | PRN
  Administered 2024-09-18: 5 mg via ORAL
  Filled 2024-09-18: qty 1

## 2024-09-18 MED ORDER — DIVALPROEX SODIUM ER 500 MG PO TB24
500.0000 mg | ORAL_TABLET | Freq: Two times a day (BID) | ORAL | Status: DC
  Administered 2024-09-18 – 2024-09-21 (×7): 500 mg via ORAL
  Filled 2024-09-18 (×9): qty 1

## 2024-09-18 MED ORDER — ASPIRIN 81 MG PO TBEC
81.0000 mg | DELAYED_RELEASE_TABLET | Freq: Every day | ORAL | Status: DC
  Administered 2024-09-18 – 2024-09-21 (×4): 81 mg via ORAL
  Filled 2024-09-18 (×4): qty 1

## 2024-09-18 MED ORDER — SODIUM CHLORIDE 0.9 % IV SOLN: Freq: Once | INTRAVENOUS | Status: AC

## 2024-09-18 MED ORDER — ACETAMINOPHEN 500 MG PO TABS: 1000.0000 mg | ORAL_TABLET | Freq: Four times a day (QID) | ORAL | Status: DC | PRN

## 2024-09-18 MED ORDER — DONEPEZIL HCL 10 MG PO TABS
10.0000 mg | ORAL_TABLET | Freq: Every day | ORAL | Status: DC
  Administered 2024-09-18 – 2024-09-20 (×3): 10 mg via ORAL
  Filled 2024-09-18 (×3): qty 1

## 2024-09-18 MED ORDER — ACETAMINOPHEN 500 MG PO TABS
1000.0000 mg | ORAL_TABLET | Freq: Four times a day (QID) | ORAL | Status: DC
  Administered 2024-09-18 – 2024-09-21 (×10): 1000 mg via ORAL
  Filled 2024-09-18 (×11): qty 2

## 2024-09-18 MED ORDER — DOCUSATE SODIUM 100 MG PO CAPS: 100.0000 mg | ORAL_CAPSULE | Freq: Two times a day (BID) | ORAL | Status: DC | PRN

## 2024-09-18 MED ORDER — ROSUVASTATIN CALCIUM 5 MG PO TABS
5.0000 mg | ORAL_TABLET | Freq: Every day | ORAL | Status: DC
  Administered 2024-09-18 – 2024-09-21 (×4): 5 mg via ORAL
  Filled 2024-09-18 (×4): qty 1

## 2024-09-18 MED ORDER — PRIMIDONE 50 MG PO TABS
50.0000 mg | ORAL_TABLET | Freq: Two times a day (BID) | ORAL | Status: DC
  Administered 2024-09-18 – 2024-09-21 (×7): 50 mg via ORAL
  Filled 2024-09-18 (×8): qty 1

## 2024-09-18 MED ORDER — SENNA 8.6 MG PO TABS: 2.0000 | ORAL_TABLET | Freq: Two times a day (BID) | ORAL | Status: DC | PRN

## 2024-09-18 MED ORDER — FENTANYL CITRATE (PF) 50 MCG/ML IJ SOSY
50.0000 ug | PREFILLED_SYRINGE | Freq: Once | INTRAMUSCULAR | Status: AC
  Administered 2024-09-18: 50 ug via INTRAVENOUS
  Filled 2024-09-18: qty 1

## 2024-09-18 MED ORDER — CITALOPRAM HYDROBROMIDE 20 MG PO TABS
20.0000 mg | ORAL_TABLET | Freq: Every day | ORAL | Status: DC
  Administered 2024-09-18 – 2024-09-21 (×4): 20 mg via ORAL
  Filled 2024-09-18: qty 1
  Filled 2024-09-18: qty 2
  Filled 2024-09-18 (×2): qty 1

## 2024-09-18 NOTE — Assessment & Plan Note (Addendum)
 Likely mechanical fall secondary to patient's chronic gait instability.  Continues to complain of right leg pain, likely secondary to visible contusion.  Goal for this hospitalization will be to mobilize patient, though patient's intellectual delay and behavioral component of pain will pose a significant challenge. - Admit to FMTS inpatient, Dr. Donzetta attending, MedSurg level of care - PT and OT eval and treat - Fall precautions - Delirium precautions - Can consider shunt series, but CT head reassuringly stable and patient likely at neurological baseline - Pain regimen: Acetaminophen  1000 mg Q6h SCH, oxycodone  IR 5 mg prior to PT - Reassess pain and consider adding further agents, but prefer to avoid NSAIDs given Hx CKD and GERD

## 2024-09-18 NOTE — Assessment & Plan Note (Addendum)
-   Congenital intellectual delay: Continue memantine  10 mg and donepezil  10 mg daily - Seizure disorder: Continue divalproex  500 mg BID - Tremors: Continue primidone  50 mg BID - Constipation: Continue senna BID - Hyperlipidemia, Hx TIA: Continue rosuvastatin  5 mg, ASA 81 mg - GERD: Continue formulary equivalent of omeprazole 20 mg daily (pantoprazole  40 mg), famotidine  20 mg QHS - Anxiety: Continue escitalopram 20 mg daily - Department allergies: Continue cetirizine 10 mg formulary equivalent (loratadine  10 mg) - Gait instability, muscle weakness, scoliosis: Ambulate with walker - Chronically enlarged ventricles: Shunt in place on imaging

## 2024-09-18 NOTE — ED Notes (Signed)
 Pt was able to stand up but refused to walk due to pain in her right leg. Pt just kept saying that it hurts too much

## 2024-09-18 NOTE — ED Notes (Signed)
 Patient alert and oriented to reported baseline. Reports pain to right hip leg only after fall this morning. Denied dizziness or headache.

## 2024-09-18 NOTE — H&P (Signed)
 "    Hospital Admission History and Physical Service Pager: [phone]  Patient name: Angela Caldwell Medical record number: [id number] Date of Birth: 25-Nov-1950 Age: 74 y.o. Gender: female  Primary Care Provider: Heywood Caldwell (Dr. Venson) Consultants: None Code Status: FULL which was confirmed with legal guardian Angela Caldwell via phone Preferred Emergency Contact: Sister Angela Caldwell is legal guardian Contact Information     Name Relation Home Work Mobile   Caldwell,Angela Sister [phone]  [phone]      Other Contacts   None on File    Chief Complaint: Right leg pain, inability to ambulate  Differential and Medical Decision Making:  Angela Caldwell is a 74 y.o. female presenting with mechanical fall at her facility not on thinners.  Differential for this patient's fall includes seizure, hyponatremia, UTI, TIA, and chronic gait instability.  The patient's persistent ability to ambulate is likely secondary to her right lateral hip contusion visible on physical exam and on CT imaging; no further acute findings on extensive RLE imaging.  - Seizure ruled out: Patient has been stable on her antiepileptics for several years and based on facility report, there is no concern that patient had seizure; there was no postictal period after fall. - Hyponatremia: Possibly contributory with Na 127 this morning, though patient is chronically in low 130s.  Possibly secondary to poor p.o. intake as well as patient's chronic antiepileptic (divalproex ) and SSRI (citalopram ) use. - UTI ruled out: Patient does not report symptoms.  CT showed mild bladder wall thickening but UA is wholly unremarkable except for some hemoglobin which can be evaluated with repeat outpatient. - TIA: There was concern for TIA with patient's prior ED visit on 12/12 given facial droop at the time.  No facial droop or dysarthria reported this hospitalization to suggest TIA.  At neurological baseline, though will confirm with  family when they arrive at bedside. - Chronic gait instability: Suspect patient had a mechanical fall secondary to her difficulty ambulating with walker; she has frequent falls at her facility.  She appears deconditioned and overall has diminished muscle weakness with notable scoliosis. Assessment & Plan Fall Unable to ambulate Contusion of right hip Likely mechanical fall secondary to patient's chronic gait instability.  Continues to complain of right leg pain, likely secondary to visible contusion.  Goal for this hospitalization will be to mobilize patient, though patient's intellectual delay and behavioral component of pain will pose a significant challenge. - Admit to FMTS inpatient, Dr. Donzetta attending, MedSurg level of care - PT and OT eval and treat - Fall precautions - Delirium precautions - Can consider shunt series, but CT head reassuringly stable and patient likely at neurological baseline - Pain regimen: Acetaminophen  1000 mg Q6h SCH, oxycodone  IR 5 mg prior to PT - Reassess pain and consider adding further agents, but prefer to avoid NSAIDs given Hx CKD and GERD Hyponatremia Moderate hyponatremia, Na lowest in several months.  Possibly secondary to poor PO intake, likely affected by patient's chronic antiepileptic or SSRI use.  Patient did receive a liter of NS on admission, cannot obtain accurate urine studies at this time. - AM BMP, continue to monitor Na Chronic health problem - Congenital intellectual delay: Continue memantine  10 mg and donepezil  10 mg daily - Seizure disorder: Continue divalproex  500 mg BID - Tremors: Continue primidone  50 mg BID - Constipation: Continue senna BID - Hyperlipidemia, Hx TIA: Continue rosuvastatin  5 mg, ASA 81 mg - GERD: Continue formulary equivalent of omeprazole 20 mg daily (pantoprazole  40 mg), famotidine   20 mg QHS - Anxiety: Continue escitalopram 20 mg daily - Department allergies: Continue cetirizine 10 mg formulary equivalent (loratadine   10 mg) - Gait instability, muscle weakness, scoliosis: Ambulate with walker - 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 female presenting from her facility at [facility] after mechanical fall in the bathroom overnight.  On evaluation, the patient reported that she was having pain in her right hip.  She denied dyspnea, chest pain, palpitations.  She also denied dysuria, urinary frequency, and urinary urgency.  She is incontinent of stool and urine at baseline.  The patient is able to identify her stuffed animal at bedside and engage in simple conversation.  Called patient's sister Angela Caldwell for collateral, who is patient's HCPOA.  She reports that patient has recently had multiple falls.  Overnight, Angela received a call from [facility] that the patient had fallen and was complaining of pain in the side of her face.  She has been moved to [facility] because she is requiring significant assistance with her ADLs and her previous group home could not provide the necessary level of care.  At [facility], the patient is struggling to work with physical therapy and has significant gait instability.  She ambulates with a walker at baseline and has been increasingly unsteady on her feet.  Of note, chart review shows that patient has had ED visits on 9/2, 10/17-18, and 12/4 for falls as well.  Angela reports that the patient is stable on Depakote  and has not had a seizure in several years.  She also emphasizes the patient's shunt in her head.  In the ED, patient arrived at neurological baseline.  She complained of right leg pain.  Labs were obtained, significant for sodium 127 compared to baseline of approximately 133.  Patient did receive a liter of NS.  Radiography of right hip was obtained, and unremarkable.  CT head obtained and also unchanged from  chronic findings of bilateral lateral ventricular enlargement with shunt in Caldwell.  Patient continued to complain of pain and radiography right femur was obtained and unremarkable; received fentanyl  and Percocet.  Was still unable to ambulate and CT right femur was obtained without acute findings.  FMTS was consulted for admission for inability ambulate and hyponatremia.  Review Of Systems: Per HPI   Pertinent Past Medical History: Seizure disorder: Last seizure years ago Scoliosis, ambulates with walker, leans to left Frequent falls Possible TIA in December 2025  Remainder reviewed in history tab.    Pertinent Past Surgical History: Occipital approach intraventricular shunt placement many years ago  Remainder reviewed in history tab.   Pertinent Social History: Tobacco use: Never smoked Alcohol use: None Other Substance use: None Lives at [facility], requires assistance with ADLs at baseline, incontinent of stool and urine   Pertinent Family History: No family history of cancer, diabetes Intellectual delay in patient and her sister   Important Outpatient Medications: Senna oral 2 tablets QHS Memantine  10 mg daily Aricept  10 mg QHS Omeprazole 20 mg daily Rosuvastatin  5 mg daily Chlorhexidine oral rinse daily Citalopram  20 mg daily Ergocalciferol 50,000 IU q7days Famotidine  20 mg QHS Cetirizine 10 mg daily Aspirin  81 mg daily Amoxicillin 500 mg TID: Finished regimen for oral infection on 1/11   Objective: BP 136/66   Pulse (!) 57   Temp (!) 97.5 F (36.4 C) (Oral)  Resp 19   SpO2 100%  Exam: General: Resting in bed in NAD.  Cognitively delayed. Eyes: PERRLA. HET: Macrocephalic, some frontal bossing.  Atraumatic head/skull.  Clear oropharynx. Neck: Supple, full ROM. Cardiovascular: RRR.  No murmur/rub/gallops. Respiratory: CTAB, no wheezes or crackles.  Normal WoB on room air. Gastrointestinal: No TTP in all quadrants including suprapubic region.  Soft, nondistended. GU: Diapers in Caldwell, incontinent of urine and stool. MSK: 4/5 bilateral LE strength at hip. Derm: ~3 cm across purpuric ecchymosis on right lateral thigh without significant overlying edema or laceration, TTP. Psych: Alert and oriented to self, Caldwell, time, situation within limits of cognitive delay. Pleasant, appropriate.  Neurological examination: MS:  Awake, alert, interactive. Normal eye contact, answered questions appropriately within limits of intellectual disability, speech was fluent without dysarthria. Cranial nerves:    CN II:  PERRLA.  Blinks normally to threat bilaterally. CNs III, IV, VI:  Full extraocular eye movement without nystagmus.  No ptosis or diplopia. CN V:  Facial sensation is normal, no weakness of masticatory muscles.  CN VII:  No facial weakness or asymmetry. CN VIII:  Auditory acuity grossly normal. CNs XI/X:  Palate elevation symmetric. CN XI:  Normal sternocleidomastoid and trapezius strength. CN XII: Tongue is midline without atrophy or fasciculations. MOTOR:  Strength 5/5 RUE, 4/5 LUE, 5/5 RLE, 4/5 LLE. COORDINATION:  Unable to follow instructions for finger-to-nose. Mild tremor. SENSATION:  Intact to light touch over all four extremities. GAIT & BALANCE:  Not able to assess.   Labs:  CBC BMET  Recent Labs  Lab 09/18/24 0406  WBC 9.1  HGB 14.7  HCT 41.6  PLT 247   Recent Labs  Lab 09/18/24 0406  NA 127*  K 4.7  CL 92*  CO2 25  BUN <5*  CREATININE 0.70  GLUCOSE 92  CALCIUM  9.2     Pertinent additional labs: - UA with moderate hemoglobin on dipstick, microscopic RBC 0-5 hpf; negative nitrite, negative leukocytes  EKG:  NSR.  No ischemic changes.  QTcB 468.  Low voltage overall.  Imaging Studies Performed: - CT head WO: No acute intracranial findings, somewhat motion limited.  No skull fracture.  Severe enlargement bilateral lateral ventricles, unchanged from prior.  Right occipital shunt catheter in Caldwell. - CT  right hip WO: Soft tissue contusion lateral at level of right hip.  Mild bladder wall thickening versus underdistention. - DG right hip and femur: No evidence of acute fracture or bone lesion, no acute findings.   Amparo Donalson, MD 09/18/2024, 8:24 AM PGY-2, [facility]  FPTS Intern pager: [phone], text pages welcome Secure chat group [facility] Teaching Service "

## 2024-09-18 NOTE — ED Notes (Signed)
 Sister [name] called and notified that patient is being admitted.

## 2024-09-18 NOTE — ED Notes (Signed)
 Pt brief changed.

## 2024-09-18 NOTE — ED Notes (Signed)
 Patient transported to CT

## 2024-09-18 NOTE — ED Notes (Signed)
 Patient continues to grimace with movement or light touch to right lower extremity. Patient continues to verbalize repeatedly that it hurts and identifies right hip. Alert and oriented to her baseline upon arrival.

## 2024-09-18 NOTE — ED Provider Notes (Signed)
" °  Physical Exam  BP 136/66   Pulse (!) 57   Temp (!) 97.5 F (36.4 C) (Oral)   Resp 19   SpO2 100%   Physical Exam  Procedures  Procedures  ED Course / MDM   Clinical Course as of 09/18/24 0842  Fri Sep 18, 2024  0711 Nursing attempted to ambulate the patient.  Reported ongoing right lower leg pain.  Reports pain in the right hip and right mid femur.  Will obtain femur films. [CH]    Clinical Course User Index [CH] Horton, Charmaine FALCON, MD   Medical Decision Making Amount and/or Complexity of Data Reviewed Labs: ordered. Radiology: ordered.  Risk Prescription drug management.   Patient unfortunately still unable to ambulate, mild hyponatremia.  Admitted to family medicine service.       Mannie Pac T, DO 09/18/24 [phone]  "

## 2024-09-18 NOTE — ED Triage Notes (Signed)
 Pt bib [facility] from [facility] c/o of fall. Went to go to the bathroom, fell and hit her chin. Denies LOC and thinners. Denies chin pain at this time, c/o of r sided leg pain.   VSS

## 2024-09-18 NOTE — Assessment & Plan Note (Addendum)
 Moderate hyponatremia, Na lowest in several months.  Possibly secondary to poor PO intake, likely affected by patient's chronic antiepileptic or SSRI use.  Patient did receive a liter of NS on admission, cannot obtain accurate urine studies at this time. - AM BMP, continue to monitor Na

## 2024-09-18 NOTE — ED Notes (Signed)
 In and out cath done. Pt did not have a lot of urine and brief was wet. Brief changed, sample sent

## 2024-09-18 NOTE — Plan of Care (Signed)
 ?  Problem: Clinical Measurements: ?Goal: Will remain free from infection ?Outcome: Progressing ?  ?

## 2024-09-18 NOTE — Assessment & Plan Note (Signed)
 Likely mechanical fall secondary to patient's chronic gait instability.  Continues to complain of right leg pain, likely secondary to visible contusion.  Goal for this hospitalization will be to mobilize patient, though patient's intellectual delay and behavioral component of pain will pose a significant challenge. - Admit to FMTS inpatient, Dr. Donzetta attending, MedSurg level of care - PT and OT eval and treat - Fall precautions - Delirium precautions - Can consider shunt series, but CT head reassuringly stable and patient likely at neurological baseline - Pain regimen: Acetaminophen  1000 mg Q6h SCH, oxycodone  IR 5 mg prior to PT - Reassess pain and consider adding further agents, but prefer to avoid NSAIDs given Hx CKD and GERD

## 2024-09-18 NOTE — ED Provider Notes (Signed)
 " [place] [facility] Provider Note   CSN: [id number] Arrival date & time: 09/18/24  9780     Patient presents with: Angela Caldwell is a 74 y.o. female.   HPI    This is a 74 year old female who presents after reported fall.  She lives at [facility].  Reports that she went to the bathroom and fell.  She cannot provide me any additional history.  Reporting that her right leg hurts.  She states she is in significant pain.  Prior history of seizures and intellectual disability.  She is awake and alert.  No blood thinners per report. Prior to Admission medications  Medication Sig Start Date End Date Taking? Authorizing Provider  acetaminophen  (TYLENOL ) 650 MG CR tablet Take 650 mg by mouth every 6 (six) hours as needed for pain or fever.    [provider]  albuterol  (VENTOLIN  HFA) 108 (90 Base) MCG/ACT inhaler Inhale 2 puffs into the lungs every 4 (four) hours as needed for wheezing or shortness of breath. 08/06/24   Alva Larraine FALCON, PA-C  aspirin  EC 81 MG tablet Take 81 mg by mouth daily.    [provider]  cetirizine (ZYRTEC) 10 MG tablet Take 10 mg by mouth daily.    [provider]  chlorhexidine (PERIDEX) 0.12 % solution Use as directed 30 mLs in the mouth or throat at bedtime.    [provider]  citalopram  (CELEXA ) 20 MG tablet Take 20 mg by mouth daily.    [provider]  divalproex  (DEPAKOTE  ER) 250 MG 24 hr tablet Take 500 mg by mouth in the morning and at bedtime.    [provider]  donepezil  (ARICEPT ) 10 MG tablet Take 10 mg by mouth at bedtime.    [provider]  famotidine  (PEPCID ) 20 MG tablet Take 20 mg by mouth at bedtime.    [provider]  guaifenesin  (ROBITUSSIN) 100 MG/5ML syrup Take 200 mg by mouth every 4 (four) hours as needed for cough.    [provider]  hydrOXYzine (ATARAX) 25 MG tablet Take 25 mg by mouth every 8 (eight) hours as  needed for itching or anxiety (cough).    [provider]  memantine  (NAMENDA ) 10 MG tablet Take 10 mg by mouth daily.    [provider]  Midazolam  5 MG/0.1ML SOLN Place 5 mg into the nose as needed (seizure). 08/15/24   Drusilla Sabas RAMAN, MD  omeprazole (PRILOSEC) 20 MG capsule Take 40 mg by mouth daily.    [provider]  primidone  (MYSOLINE ) 50 MG tablet Take 50 mg by mouth in the morning and at bedtime.    [provider]  rosuvastatin  (CRESTOR ) 5 MG tablet Take 5 mg by mouth daily.    [provider]  senna-docusate (SENOKOT-S) 8.6-50 MG tablet Take 2 tablets by mouth at bedtime.    [provider]  sennosides-docusate sodium  (SENOKOT-S) 8.6-50 MG tablet Take 2 tablets by mouth 2 (two) times daily as needed for constipation.    [provider]  Vitamin D, Ergocalciferol, (DRISDOL) 1.25 MG (50000 UNIT) CAPS capsule Take 50,000 Units by mouth every 7 (seven) days. Friday    [provider]    Allergies: Patient has no known allergies.    Review of Systems  Constitutional:  Negative for fever.  Musculoskeletal:        Right hip pain  Neurological:  Negative for seizures and syncope.  All other  systems reviewed and are negative.   Updated Vital Signs BP 113/70   Pulse 65   Temp (!) 97.5 F (36.4 C) (Oral)   Resp 20   SpO2 97%   Physical Exam Vitals and nursing note reviewed.  Constitutional:      Appearance: She is well-developed.     Comments: Chronically ill-appearing but nontoxic  HENT:     Head: Normocephalic and atraumatic.  Eyes:     Pupils: Pupils are equal, round, and reactive to light.  Cardiovascular:     Rate and Rhythm: Normal rate and regular rhythm.     Heart sounds: Normal heart sounds.  Pulmonary:     Effort: Pulmonary effort is normal. No respiratory distress.     Breath sounds: No wheezing.  Abdominal:     Palpations: Abdomen is soft.  Musculoskeletal:     Cervical back: Neck  supple.     Comments: Tenderness palpation right hip pain with range of motion, no obvious foreshortening or deformity.  DP pulses distally limited exam of the knee because of pain with range of motion at the hip and upper leg  Skin:    General: Skin is warm and dry.  Neurological:     Mental Status: She is alert and oriented to person, place, and time.  Psychiatric:        Mood and Affect: Mood normal.     (all labs ordered are listed, but only abnormal results are displayed) Labs Reviewed  BASIC METABOLIC PANEL WITH GFR - Abnormal; Notable for the following components:      Result Value   Sodium 127 (*)    Chloride 92 (*)    BUN <5 (*)    All other components within normal limits  URINALYSIS, ROUTINE W REFLEX MICROSCOPIC - Abnormal; Notable for the following components:   Hgb urine dipstick MODERATE (*)    All other components within normal limits  URINALYSIS, MICROSCOPIC (REFLEX) - Abnormal; Notable for the following components:   Bacteria, UA RARE (*)    All other components within normal limits  CBC WITH DIFFERENTIAL/PLATELET    EKG: None  Radiology: CT Hip Right Wo Contrast Result Date: 09/18/2024 EXAM: CT OF THE RIGHT HIP WITHOUT IV CONTRAST 09/18/2024 05:34:33 AM TECHNIQUE: CT of the right hip was performed without the administration of intravenous contrast. Multiplanar reformatted images are provided for review. Automated exposure control, iterative reconstruction, and/or weight based adjustment of the mA/kV was utilized to reduce the radiation dose to as low as reasonably achievable. COMPARISON: AP pelvis film 08/06/2024, AP pelvis and right hip views earlier today. CLINICAL HISTORY: Hip trauma, fracture suspected, xray done. FINDINGS: BONES: There is normal bone mineralization. There is no evidence of fractures. No aggressive appearing osseous abnormality or periostitis. There are no suspicious bone lesions. Slight spurring of the symphysis pubis and right SI joint. SOFT  TISSUE: Subcutaneous stranding at the level of the right hip is consistent with a soft tissue contusion. There is no space-occupying hematoma or mass. No soft tissue mass. 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 No osseous erosions. INTRAPELVIC CONTENTS: There is mild bladder thickening versus changes of underdistention. Perivesical stranding is present and could indicate cystitis, warranting further evaluation with urinalysis. There is air in the anterior bladder, which could be due to recent instrumentation, catheterization, infectious process or fistula. There is a complex 4 cm partially calcified fibroid of the dorsal body of the uterus. There is pelvic floor laxity without a cystocele. No  other pelvic mass or fluid collection. IMPRESSION: 1. No acute fracture. 2. Soft tissue contusion laterally  at the level of the right hip. 3. Mild bladder wall thickening versus underdistention with perivesical stranding and air in the anterior bladder, which can be seen with recent instrumentation, infection, or fistula; urinalysis is recommended for further evaluation. 4. Fibroid uterus. Electronically signed by: Francis Quam MD 09/18/2024 06:14 AM EST RP Workstation: [id number]   DG Hip Unilat W or Wo Pelvis 2-3 Views Right Result Date: 09/18/2024 EXAM: 2 or 3 VIEW(S) XRAY OF THE RIGHT HIP 09/18/2024 04:40:00 AM COMPARISON: AP pelvis 08/06/2024. CLINICAL HISTORY: fall with right hip pain. FINDINGS: BONES AND JOINTS: No evidence of fractures. No malalignment. Mild symmetric arthrosis of the hip joints. Spurring noted at the SI joints. LUMBAR SPINE: Advanced L5-S1 facet hypertrophy. SOFT TISSUES: There are overlying artifacts from tubing, sheets, and clothing. IMPRESSION: 1. No acute findings. 2. Degenerative changes. Stable exam. Electronically signed by: Francis Quam MD 09/18/2024 05:03 AM EST RP Workstation: [id number]   CT Head Wo Contrast Result Date: 09/18/2024 EXAM: CT HEAD WITHOUT  CONTRAST 09/18/2024 04:32:11 AM TECHNIQUE: CT of the head was performed without the administration of intravenous contrast. Automated exposure control, iterative reconstruction, and/or weight based adjustment of the mA/kV was utilized to reduce the radiation dose to as low as reasonably achievable. COMPARISON: Head CTs dated 08/15/2024 and 08/14/2024. CLINICAL HISTORY: Head trauma, minor (Age >= 65y). Patient fell and hit her chin. FINDINGS: LIMITATIONS: The study is limited due to motion. BRAIN AND VENTRICLES: A right occipital approach shunt catheter is again noted with one end in the posterior horn of the right lateral ventricle and the other end at the dorsal foramen magnum. 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 No obvious acute infarct, hemorrhage or mass is seen. No midline shift. No extra-axial collection. ORBITS: No acute abnormality. SINUSES: The paranasal sinuses are clear. There is mild fluid posteriorly in the right mastoid air cells. The left mastoids are clear. SOFT TISSUES AND SKULL: There is no scalp hematoma or depressed skull fracture. No acute soft tissue abnormality. No skull fracture. IMPRESSION: 1. No acute intracranial CT findings within the limits of motion artifact. 2. No depressed skull fracture. 3. Severe enlargement of the lateral ventricles, unchanged. 4. 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 Electronically signed by: Francis Quam MD 09/18/2024 04:58 AM EST RP Workstation: [id number]     Procedures   Medications Ordered in the ED  oxyCODONE -acetaminophen  (PERCOCET/ROXICET) 5-325 MG per tablet 1 tablet (has no administration in time range)  fentaNYL  (SUBLIMAZE ) injection 50 mcg (50 mcg Intravenous Given 09/18/24 0420)  ondansetron  (ZOFRAN ) injection 4 mg (4 mg Intravenous Given 09/18/24 0420)  0.9 %  sodium chloride  infusion ( Intravenous New Bag/Given 09/18/24 0535)    Clinical Course as of 09/18/24 0712  Fri Sep 18, 2024  0711 Nursing  attempted to ambulate the patient.  Reported ongoing right lower leg pain.  Reports pain in the right hip and right mid femur.  Will obtain femur films. [CH]    Clinical Course User Index [CH] Kameisha Malicki, Charmaine FALCON, MD                                 Medical Decision Making Amount and/or Complexity of Data Reviewed Labs: ordered. Radiology: ordered.  Risk Prescription drug management.   This patient presents to the ED for concern of fall, leg pain, this involves an  extensive number of treatment options, and is a complaint that carries with it a high risk of complications and morbidity.  I considered the following differential and admission for this acute, potentially life threatening condition.  The differential diagnosis includes acute injury such as head injury, long bone fracture, metabolic derangement, urinary tract infection  MDM:    This is a 74 year old female who presents after a fall.  She is nontoxic.  Vital signs are notable for blood pressure of 150/102.  Mostly complaining of right leg pain.  No obvious deformity but does have tenderness to palpation of the right hip.  No overlying skin changes or contusion.  Labs obtained.  Notable for sodium of 127.  Baseline sodium 133.  Patient given a liter of fluids.  Initial x-rays of the hip are negative.  She continues to complain of pain.  CT does not show any obvious fracture.  Patient would not ambulate complaining of ongoing right leg pain.  Will extend x-ray imaging into the femur.  Again no overlying contusion or obvious external signs of trauma.  (Labs, imaging, consults)  Labs: I Ordered, and personally interpreted labs.  The pertinent results include: CBC, BMP, urinalysis  Imaging Studies ordered: I ordered imaging studies including CT head, CT hip, right hip x-ray I independently visualized and interpreted imaging. I agree with the radiologist interpretation  Additional history obtained from EMS.  External records from  outside source obtained and reviewed including prior evaluations  Cardiac Monitoring: The patient was maintained on a cardiac monitor.  If on the cardiac monitor, I personally viewed and interpreted the cardiac monitored which showed an underlying rhythm of: Sinus  Reevaluation: After the interventions noted above, I reevaluated the patient and found that they have :stayed the same  Social Determinants of Health:  lives in facility  Disposition: Pending further workup  Co morbidities that complicate the patient evaluation  Past Medical History:  Diagnosis Date   Intellectual disability    Seizure (HCC)      Medicines Meds ordered this encounter  Medications   fentaNYL  (SUBLIMAZE ) injection 50 mcg   ondansetron  (ZOFRAN ) injection 4 mg   0.9 %  sodium chloride  infusion   oxyCODONE -acetaminophen  (PERCOCET/ROXICET) 5-325 MG per tablet 1 tablet    Refill:  0    I have reviewed the patients home medicines and have made adjustments as needed  Problem List / ED Course: Problem List Items Addressed This Visit   None Visit Diagnoses       Fall, initial encounter    -  Primary     Pain of right lower extremity         Hyponatremia                    Final diagnoses:  Fall, initial encounter  Pain of right lower extremity  Hyponatremia    ED Discharge Orders     None          Tonee Silverstein, Charmaine FALCON, MD 09/18/24 [phone]  "

## 2024-09-18 NOTE — Plan of Care (Signed)
 [facility] Brief Progress Note  S: Patient was seen and examined at bedside by myself and Dr. Elicia.  She is sleeping comfortably in hospital bed and is easy to wake.  She has no complaints.  O: BP (!) 106/57 (BP Location: Left Arm)   Pulse 66   Temp 98.4 F (36.9 C)   Resp 16   Wt 58.3 kg   SpO2 95%   BMI 25.10 kg/m   General: A&O, NAD, easy to wake HEENT: No sign of trauma, EOM grossly intact, neck supple with FROM Respiratory: normal WOB GI: non-distended  Extremities: no peripheral edema. Neuro: moves all four extremities appropriately Skin: no lesions/rashes visualized Psych: Appropriate mood and affect   A/P: Plan as per H&P including the following: - Orders reviewed. Labs for AM ordered, which was adjusted as needed.  - If condition changes, plan includes contact [facility].   Lupie Credit, DO 09/18/2024, 8:52 PM PGY-1, [facility] Night Resident  Please page [phone] with questions.

## 2024-09-19 DIAGNOSIS — M79604 Pain in right leg: Secondary | ICD-10-CM

## 2024-09-19 DIAGNOSIS — W19XXXA Unspecified fall, initial encounter: Secondary | ICD-10-CM | POA: Diagnosis not present

## 2024-09-19 DIAGNOSIS — E871 Hypo-osmolality and hyponatremia: Secondary | ICD-10-CM | POA: Diagnosis not present

## 2024-09-19 DIAGNOSIS — S7001XA Contusion of right hip, initial encounter: Secondary | ICD-10-CM | POA: Diagnosis not present

## 2024-09-19 LAB — BASIC METABOLIC PANEL WITH GFR
Anion gap: 8 (ref 5–15)
BUN: 14 mg/dL (ref 8–23)
CO2: 25 mmol/L (ref 22–32)
Calcium: 8.3 mg/dL — ABNORMAL LOW (ref 8.9–10.3)
Chloride: 98 mmol/L (ref 98–111)
Creatinine, Ser: 0.77 mg/dL (ref 0.44–1.00)
GFR, Estimated: >60 mL/min
Glucose, Bld: 110 mg/dL — ABNORMAL HIGH (ref 70–99)
Potassium: 4.3 mmol/L (ref 3.5–5.1)
Sodium: 130 mmol/L — ABNORMAL LOW (ref 135–145)

## 2024-09-19 NOTE — Assessment & Plan Note (Signed)
 Pain controlled.  - Pending PT/OT eval for dispo planning. - Tylenol  1000mg  q6h sch for pain  - consider oxycodone  for breakthrough pain

## 2024-09-19 NOTE — Evaluation (Signed)
 Occupational Therapy Evaluation Patient Details Name: Angela Caldwell MRN: [id number] DOB: 1950/12/25 Today's Date: 09/19/2024   History of Present Illness   74 y.o. female presents 09/18/24 after fall at [facility] with R hip pain. Radiography of right hip was obtained, and unremarkable.  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 CT right femur was obtained without acute findings. PMH: congenital intellectual delay, seizure disorder, tremors, constipation, HLD, GERD, anxiety, gait instability, scoliosis     Clinical Impressions Pt presents from [facility] at which she received assistance to complete ADLs and ambulated with RW but has significant history of falls. Pt currently requires up to Max A for ADL engagement up to Mod A +2 for functional transfers. Pt primarily limited by pain, decreased activity tolerance, generalized weakness. OT to continue to follow Pt acutely to facilitate progress towards goals. Patient will benefit from continued inpatient follow up therapy, <3 hours/day.      If plan is discharge home, recommend the following:   A lot of help with walking and/or transfers;A lot of help with bathing/dressing/bathroom;Assistance with cooking/housework;Direct supervision/assist for medications management;Direct supervision/assist for financial management;Assist for transportation;Help with stairs or ramp for entrance;Supervision due to cognitive status     Functional Status Assessment   Patient has had a recent decline in their functional status and demonstrates the ability to make significant improvements in function in a reasonable and predictable amount of time.     Equipment Recommendations   Other (comment) (defer)     Recommendations for Other Services         Precautions/Restrictions   Precautions Precautions: Fall Recall of Precautions/Restrictions:  Impaired Restrictions Weight Bearing Restrictions Per Provider Order: No     Mobility Bed Mobility Overal bed mobility: Needs Assistance Bed Mobility: Supine to Sit, Sit to Supine     Supine to sit: +2 for physical assistance, Total assist, +2 for safety/equipment Sit to supine: +2 for physical assistance, Total assist, +2 for safety/equipment   General bed mobility comments: +2 total A via helicopter.    Transfers Overall transfer level: Needs assistance Equipment used: 2 person hand held assist Transfers: Sit to/from Stand Sit to Stand: +2 physical assistance, Mod assist           General transfer comment: Pt requiring max verbal cues to stand however was able to perform with +2 Mod A. Pt noted with leg pressed again bed however was able to take a singular step to L. Pt unable to march in place due to pain and requesting to sit back down.      Balance Overall balance assessment: Needs assistance Sitting-balance support: Bilateral upper extremity supported, Feet supported Sitting balance-Leahy Scale: Fair     Standing balance support: Bilateral upper extremity supported, During functional activity Standing balance-Leahy Scale: Poor Standing balance comment: Reliant on external support                           ADL either performed or assessed with clinical judgement   ADL Overall ADL's : Needs assistance/impaired Eating/Feeding: Set up;Sitting   Grooming: Minimal assistance;Sitting   Upper Body Bathing: Minimal assistance   Lower Body Bathing: Maximal assistance   Upper Body Dressing : Minimal assistance   Lower Body Dressing: Maximal assistance   Toilet Transfer: Stand-pivot;+2 for physical assistance;+2 for safety/equipment;Moderate assistance;BSC/3in1   Toileting- Clothing Manipulation and Hygiene: Total assistance  Vision Baseline Vision/History: 1 Wears glasses Additional Comments: Pt with eyes closed during session,  would open eyes to attend to staff as she wanted     Perception         Praxis         Pertinent Vitals/Pain Pain Assessment Pain Assessment: Faces Faces Pain Scale: Hurts even more Pain Location: R hip with movement Pain Descriptors / Indicators: Sore, Grimacing, Moaning Pain Intervention(s): Limited activity within patient's tolerance, Monitored during session, Repositioned     Extremity/Trunk Assessment Upper Extremity Assessment Upper Extremity Assessment: Generalized weakness   Lower Extremity Assessment Lower Extremity Assessment: Defer to PT evaluation RLE Deficits / Details: R hip contusion. RLE: Unable to fully assess due to pain   Cervical / Trunk Assessment Cervical / Trunk Assessment: Normal   Communication Communication Communication: Impaired Factors Affecting Communication: Reduced clarity of speech   Cognition Arousal: Lethargic Behavior During Therapy: Flat affect Cognition: History of cognitive impairments             OT - Cognition Comments: congenital intellectual delay                 Following commands: Impaired Following commands impaired: Follows one step commands inconsistently, Follows one step commands with increased time     Cueing  General Comments   Cueing Techniques: Verbal cues;Gestural cues;Tactile cues;Visual cues  VSS on RA. Pt with improved interactions at end of session, laughing when toy puppy was used to facilitate task engagement   Exercises     Shoulder Instructions      Home Living Family/patient expects to be discharged to:: Skilled nursing facility                                 Additional Comments: Lives at [facility]      Prior Functioning/Environment Prior Level of Function : Needs assist;History of Falls (last six months)             Mobility Comments: Per chart review, Pt ambulates with walker. Significant history of falls at facility ADLs Comments: Per chart review, Pt  receives assistance with ADLs at baseline and is incontient.    OT Problem List: Decreased strength;Decreased activity tolerance;Impaired balance (sitting and/or standing);Decreased cognition;Decreased safety awareness;Decreased knowledge of use of DME or AE;Pain   OT Treatment/Interventions: Self-care/ADL training;Therapeutic exercise;Energy conservation;DME and/or AE instruction;Therapeutic activities;Patient/family education;Balance training      OT Goals(Current goals can be found in the care plan section)   Acute Rehab OT Goals Patient Stated Goal: to get warm OT Goal Formulation: Patient unable to participate in goal setting Time For Goal Achievement: 10/03/24 Potential to Achieve Goals: Good ADL Goals Pt Will Perform Upper Body Dressing: with set-up;sitting Pt Will Perform Lower Body Dressing: with min assist;sitting/lateral leans Pt Will Transfer to Toilet: with min assist;bedside commode;stand pivot transfer Pt Will Perform Toileting - Clothing Manipulation and hygiene: with contact guard assist;sitting/lateral leans   OT Frequency:  Min 1X/week    Co-evaluation PT/OT/SLP Co-Evaluation/Treatment: Yes Reason for Co-Treatment: Complexity of the patient's impairments (multi-system involvement);Necessary to address cognition/behavior during functional activity;For patient/therapist safety;To address functional/ADL transfers PT goals addressed during session: Mobility/safety with mobility;Proper use of DME;Balance OT goals addressed during session: ADL's and self-care;Strengthening/ROM      AM-PAC OT 6 Clicks Daily Activity     Outcome Measure Help from another person eating meals?: A Little Help from another person taking care of personal grooming?:  A Little Help from another person toileting, which includes using toliet, bedpan, or urinal?: Total Help from another person bathing (including washing, rinsing, drying)?: A Lot Help from another person to put on and taking  off regular upper body clothing?: A Little Help from another person to put on and taking off regular lower body clothing?: A Lot 6 Click Score: 14   End of Session Nurse Communication: Mobility status  Activity Tolerance: Patient limited by pain;Patient limited by fatigue Patient left: in bed;with call bell/phone within reach;with nursing/sitter in room  OT Visit Diagnosis: Unsteadiness on feet (R26.81);Muscle weakness (generalized) (M62.81);History of falling (Z91.81);Pain Pain - Right/Left: Right Pain - part of body: Leg                Time: 0911-0932 OT Time Calculation (min): 21 min Charges:  OT General Charges $OT Visit: 1 Visit OT Evaluation $OT Eval Low Complexity: 1 Low  Maurilio CROME, OTR/L.  [facility]  Office: [phone]   Maurilio PARAS Chrsitopher Wik 09/19/2024, 10:45 AM

## 2024-09-19 NOTE — Evaluation (Signed)
 Physical Therapy Evaluation Patient Details Name: Angela Caldwell MRN: [id number] DOB: 1950/11/03 Today's Date: 09/19/2024  History of Present Illness  74 y.o. female presents 09/18/24 after fall at [facility] with R hip pain. Radiography of right hip was obtained, and unremarkable.  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 CT right femur was obtained without acute findings. PMH: congenital intellectual delay, seizure disorder, tremors, constipation, HLD, GERD, anxiety, gait instability, scoliosis  Clinical Impression  Pt presents with admitting diagnosis above. Co-treat with OT. Pt today was able to stand with +2 Mod A HHA. Pt very limited today by pain and baseline cognitive deficits required max encouragement to participate with therapy. Per chart review, pt is from [place] [place] [facility] where she was ambulatory with RW and has an extensive history of falls. Patient will benefit from continued inpatient follow up therapy, <3 hours/day. PT will continue to follow.          If plan is discharge home, recommend the following: Two people to help with walking and/or transfers;Two people to help with bathing/dressing/bathroom;Assistance with cooking/housework;Assistance with feeding;Direct supervision/assist for medications management;Assist for transportation;Direct supervision/assist for financial management;Help with stairs or ramp for entrance;Supervision due to cognitive status   Can travel by private vehicle   No    Equipment Recommendations None recommended by PT  Recommendations for Other Services       Functional Status Assessment Patient has had a recent decline in their functional status and demonstrates the ability to make significant improvements in function in a reasonable and predictable amount of time.     Precautions / Restrictions Precautions Precautions: Fall Recall of Precautions/Restrictions:  Impaired Restrictions Weight Bearing Restrictions Per Provider Order: No      Mobility  Bed Mobility Overal bed mobility: Needs Assistance Bed Mobility: Supine to Sit, Sit to Supine     Supine to sit: +2 for physical assistance, Total assist, +2 for safety/equipment Sit to supine: +2 for physical assistance, Total assist, +2 for safety/equipment   General bed mobility comments: +2 total A via helicopter.    Transfers Overall transfer level: Needs assistance Equipment used: 2 person hand held assist Transfers: Sit to/from Stand Sit to Stand: +2 physical assistance, Mod assist           General transfer comment: Pt requiring max verbal cues to stand however was able to perform with +2 Mod A. Pt noted with leg pressed again bed however was able to take a singular step to L. Pt unable to march in place due to pain and requesting to sit back down.    Ambulation/Gait               General Gait Details: Self limited by pain  Stairs            Wheelchair Mobility     Tilt Bed    Modified Rankin (Stroke Patients Only)       Balance Overall balance assessment: Needs assistance Sitting-balance support: Bilateral upper extremity supported, Feet supported Sitting balance-Leahy Scale: Fair     Standing balance support: Bilateral upper extremity supported, During functional activity Standing balance-Leahy Scale: Poor Standing balance comment: Reliant on external support                             Pertinent Vitals/Pain Pain Assessment Pain Assessment: Faces Faces Pain Scale: Hurts even more Pain Location: R hip with movement  Pain Descriptors / Indicators: Sore, Grimacing, Moaning Pain Intervention(s): Monitored during session, Limited activity within patient's tolerance, Repositioned    Home Living Family/patient expects to be discharged to:: Skilled nursing facility                   Additional Comments: Lives at [facility]     Prior Function Prior Level of Function : Needs assist;History of Falls (last six months)             Mobility Comments: Per chart review, Pt ambulates with walker. Significant history of falls at facility ADLs Comments: Per chart review, Pt receives assistance with ADLs at baseline and is incontient.     Extremity/Trunk Assessment   Upper Extremity Assessment Upper Extremity Assessment: Defer to OT evaluation    Lower Extremity Assessment Lower Extremity Assessment: RLE deficits/detail RLE Deficits / Details: R hip contusion. RLE: Unable to fully assess due to pain    Cervical / Trunk Assessment Cervical / Trunk Assessment: Normal  Communication   Communication Communication: Impaired Factors Affecting Communication: Reduced clarity of speech    Cognition Arousal: Lethargic Behavior During Therapy: Flat affect   PT - Cognitive impairments: History of cognitive impairments                       PT - Cognition Comments: Intellectual disability at baseline. Pt perseverating on being cold, sleepy, and in pain. Following commands: Impaired Following commands impaired: Follows one step commands inconsistently, Follows one step commands with increased time     Cueing Cueing Techniques: Verbal cues, Gestural cues, Tactile cues, Visual cues     General Comments General comments (skin integrity, edema, etc.): VSS    Exercises     Assessment/Plan    PT Assessment Patient needs continued PT services  PT Problem List Decreased strength;Decreased range of motion;Decreased activity tolerance;Decreased mobility;Decreased balance;Decreased coordination;Decreased cognition;Decreased knowledge of use of DME;Decreased safety awareness;Cardiopulmonary status limiting activity;Decreased knowledge of precautions       PT Treatment Interventions DME instruction;Gait training;Stair training;Functional mobility training;Therapeutic activities;Therapeutic exercise;Balance  training;Neuromuscular re-education;Cognitive remediation;Patient/family education    PT Goals (Current goals can be found in the Care Plan section)  Acute Rehab PT Goals Patient Stated Goal: to go home PT Goal Formulation: With patient Time For Goal Achievement: 10/03/24 Potential to Achieve Goals: Fair    Frequency Min 2X/week     Co-evaluation PT/OT/SLP Co-Evaluation/Treatment: Yes Reason for Co-Treatment: Complexity of the patient's impairments (multi-system involvement);Necessary to address cognition/behavior during functional activity;For patient/therapist safety;To address functional/ADL transfers PT goals addressed during session: Mobility/safety with mobility;Proper use of DME;Balance OT goals addressed during session: ADL's and self-care;Strengthening/ROM       AM-PAC PT 6 Clicks Mobility  Outcome Measure Help needed turning from your back to your side while in a flat bed without using bedrails?: Total Help needed moving from lying on your back to sitting on the side of a flat bed without using bedrails?: Total Help needed moving to and from a bed to a chair (including a wheelchair)?: Total Help needed standing up from a chair using your arms (e.g., wheelchair or bedside chair)?: A Lot Help needed to walk in hospital room?: Total Help needed climbing 3-5 steps with a railing? : Total 6 Click Score: 7    End of Session   Activity Tolerance: Patient limited by pain;Patient limited by lethargy Patient left: in bed;with call bell/phone within reach;with bed alarm set;with nursing/sitter in room Nurse Communication: Mobility status PT Visit  Diagnosis: Other abnormalities of gait and mobility (R26.89)    Time: 9088-9067 PT Time Calculation (min) (ACUTE ONLY): 21 min   Charges:   PT Evaluation $PT Eval Moderate Complexity: 1 Mod   PT General Charges $$ ACUTE PT VISIT: 1 Visit         Sueellen NOVAK, PT, DPT Acute Rehab Services [phone]   Khushboo Chuck 09/19/2024,  9:53 AM

## 2024-09-19 NOTE — Care Management Obs Status (Signed)
 MEDICARE OBSERVATION STATUS NOTIFICATION   Patient Details  Name: Angela Caldwell MRN: [id number] Date of Birth: 03/20/1951   Medicare Observation Status Notification Given:  Yes    Carletha Spruce, RN 09/19/2024, 3:18 PM

## 2024-09-19 NOTE — TOC Initial Note (Signed)
 Transition of Care [facility]) - Initial/Assessment Note    Patient Details  Name: Angela Caldwell MRN: [id number] Date of Birth: 26-Oct-1950  Transition of Care [facility] CM/SW Contact:    Angela Cordella Simmonds, LCSW Phone Number: 09/19/2024, 2:37 PM  Clinical Narrative:    Pt oriented x2, not able to engage in conversation.  Sister  Angela Caldwell in room with her directs CSW to other sister Angela Caldwell for DC planning.  Angela Caldwell does confirm pt is from [facility].  CSW spoke with Angela Caldwell by phone, who also confirms pt LTC at [facility], she is planning on her to return, reports she already gets some PT there.               Expected Discharge Plan: Skilled Nursing Facility Barriers to Discharge: Continued Medical Work up   Patient Goals and CMS Choice     Choice offered to / list presented to :  (sister Angela Caldwell)      Expected Discharge Plan and Services In-house Referral: Clinical Social Work   Post Acute Care Choice: Skilled Nursing Facility Living arrangements for the past 2 months: Skilled Nursing Facility [facility])                                      Prior Living Arrangements/Services Living arrangements for the past 2 months: Skilled Nursing Facility [facility]) Lives with:: Facility Resident Patient language and need for interpreter reviewed:: Yes        Need for Family Participation in Patient Care: Yes (Comment) Care giver support system in place?: Yes (comment) Current home services: Other (comment) (na) Criminal Activity/Legal Involvement Pertinent to Current Situation/Hospitalization: No - Comment as needed  Activities of Daily Living      Permission Sought/Granted                  Emotional Assessment Appearance:: Appears stated age Attitude/Demeanor/Rapport: Unable to Assess Affect (typically observed): Unable to Assess Orientation: : Oriented to Self, Oriented to Place      Admission diagnosis:  Hyponatremia [E87.1] Fall [W19.XXXA] Fall, initial  encounter [id number]] Pain of right lower extremity [M79.604] Patient Active Problem List   Diagnosis Date Noted   Pain of right lower extremity 09/19/2024   Fall 09/18/2024   Chronic health problem 09/18/2024   Hyponatremia 09/18/2024   Unable to ambulate 09/18/2024   Contusion of right hip 09/18/2024   Drooping eyelid, right 08/15/2024   History of unsteady gait 08/15/2024   Spina bifida (HCC) 08/15/2024   Chronic headache 08/15/2024   History of seizure 08/15/2024   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   Pulmonary nodule, left 07/10/2020   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

## 2024-09-19 NOTE — Plan of Care (Signed)

## 2024-09-19 NOTE — NC FL2 (Signed)
 " [place]  MEDICAID FL2 LEVEL OF CARE FORM     IDENTIFICATION  Patient Name: Angela Caldwell Birthdate: 04-10-1951 Sex: female Admission Date (Current Location): 09/18/2024  [place] and [place] Number:  Lloyd [id number] Facility and Address:  [facility] [place], [address], [place], [place] [address]      Provider Number: [id number]  Attending Physician Name and Address:  Donzetta Rollene BRAVO, MD  Relative Name and Phone Number:  CLAUDENE [name] Ahumada [phone]    Current Level of Care: Hospital Recommended Level of Care: Skilled Nursing Facility Prior Approval Number:    Date Approved/Denied:   PASRR Number: [id number]  Discharge Plan: SNF    Current Diagnoses: Patient Active Problem List   Diagnosis Date Noted   Pain of right lower extremity 09/19/2024   Fall 09/18/2024   Chronic health problem 09/18/2024   Hyponatremia 09/18/2024   Unable to ambulate 09/18/2024   Contusion of right hip 09/18/2024   Drooping eyelid, right 08/15/2024   History of unsteady gait 08/15/2024   Spina bifida (HCC) 08/15/2024   Chronic headache 08/15/2024   History of seizure 08/15/2024   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   Pulmonary nodule, left 07/10/2020   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   Ambulatory dysfunction 03/24/2014   Memory impairment 03/24/2014   Intellectual disability 03/24/2014    Orientation RESPIRATION BLADDER Height & Weight     Self, Place  Normal Incontinent Weight: 128 lb 8.5 oz (58.3 kg) Height:     BEHAVIORAL SYMPTOMS/MOOD NEUROLOGICAL BOWEL NUTRITION STATUS        Diet (see discharge summary)  AMBULATORY STATUS COMMUNICATION OF NEEDS Skin   Extensive Assist Verbally Other (Comment) (ecchymosis)                       Personal Care Assistance Level of Assistance  Bathing, Feeding, Dressing Bathing Assistance: Maximum assistance Feeding assistance: Limited assistance Dressing Assistance: Maximum assistance      Functional Limitations Info             SPECIAL CARE FACTORS FREQUENCY  PT (By licensed PT), OT (By licensed OT)     PT Frequency: 5x week OT Frequency: 5x week            Contractures Contractures Info: Not present    Additional Factors Info  Code Status, Allergies Code Status Info: full Allergies Info: NKA           Current Medications (09/19/2024):  This is the current hospital active medication list Current Facility-Administered Medications  Medication Dose Route Frequency Provider Last Rate Last Admin   acetaminophen  (TYLENOL ) tablet 1,000 mg  1,000 mg Oral Q6H Shitarev, Dimitry, MD   1,000 mg at 09/19/24 1355   aspirin  EC tablet 81 mg  81 mg Oral Daily Shitarev, Dimitry, MD   81 mg at 09/19/24 0926   citalopram  (CELEXA ) tablet 20 mg  20 mg Oral Daily Shitarev, Dimitry, MD   20 mg at 09/19/24 9073   divalproex  (DEPAKOTE  ER) 24 hr tablet 500 mg  500 mg Oral BID Shitarev, Dimitry, MD   500 mg at 09/19/24 0926   senna (SENOKOT) tablet 17.2 mg  2 tablet Oral BID PRN Paytes, Austin A, RPH       And   docusate sodium  (COLACE) capsule 100 mg  100 mg Oral BID PRN Paytes, Austin A, RPH       donepezil  (ARICEPT ) tablet 10 mg  10 mg Oral QHS Shitarev, Dimitry, MD   10  mg at 09/18/24 2218   enoxaparin  (LOVENOX ) injection 40 mg  40 mg Subcutaneous Q24H Shitarev, Dimitry, MD   40 mg at 09/19/24 9073   famotidine  (PEPCID ) tablet 20 mg  20 mg Oral QHS Shitarev, Dimitry, MD   20 mg at 09/18/24 2218   loratadine  (CLARITIN ) tablet 10 mg  10 mg Oral Daily Shitarev, Dimitry, MD   10 mg at 09/19/24 9073   memantine  (NAMENDA ) tablet 10 mg  10 mg Oral Daily Shitarev, Dimitry, MD   10 mg at 09/19/24 9073   pantoprazole  (PROTONIX ) EC tablet 40 mg  40 mg Oral Daily Shitarev, Dimitry, MD   40 mg at 09/19/24 9073   primidone  (MYSOLINE ) tablet 50 mg  50 mg Oral BID Shitarev, Dimitry, MD   50 mg at 09/19/24 9073   rosuvastatin  (CRESTOR ) tablet 5 mg  5 mg Oral Daily Shitarev, Dimitry, MD   5 mg  at 09/19/24 9073     Discharge Medications: Please see discharge summary for a list of discharge medications.  Relevant Imaging Results:  Relevant Lab Results:   Additional Information SSN: [id number]  Bridget Cordella Simmonds, LCSW     "

## 2024-09-19 NOTE — Hospital Course (Addendum)
 Angela Caldwell is a 74 y.o.female with a history of seizures, scoliosis, frequent falls who was admitted to the [facility] Medicine Teaching Service at [facility] for right leg pain following a fall at facility.   Her hospital course is detailed below:  Fall  Contusion of right hip Patient presented to ED from facility after a fall, likely due to chronic gait instability 2/2 ongoing right leg pain.  Fortunately imaging of right hip and femur showed no evidence of acute fracture.  CT head also without acute findings.***  Hyponatremia Sodium 127 on arrival.  Based on chart review this does appear to be chronic.  Thought possibly secondary to poor PO intake and/or chronic antiepileptic/SSRI use.  Patient did receive 1L IVF bolus in ED.  Sodium was monitored throughout the course of hospitalization and***.  Other chronic conditions were medically managed with home medications and formulary alternatives as necessary.   PCP Follow-up Recommendations:

## 2024-09-19 NOTE — Assessment & Plan Note (Signed)
-   Congenital intellectual delay: Continue memantine  10 mg and donepezil  10 mg daily - Seizure disorder: Continue divalproex  500 mg BID - Tremors: Continue primidone  50 mg BID - Constipation: Continue senna BID - Hyperlipidemia, Hx TIA: Continue rosuvastatin  5 mg, ASA 81 mg - GERD: Continue formulary equivalent of omeprazole 20 mg daily (pantoprazole  40 mg), famotidine  20 mg QHS - Anxiety: Continue escitalopram 20 mg daily - Department allergies: Continue cetirizine 10 mg formulary equivalent (loratadine  10 mg) - Gait instability, muscle weakness, scoliosis: Ambulate with walker - Chronically enlarged ventricles: Shunt in place on imaging

## 2024-09-19 NOTE — Assessment & Plan Note (Signed)
 Stable. - AM BMP

## 2024-09-19 NOTE — Progress Notes (Signed)
" ° ° ° °  Daily Progress Note Intern Pager: [phone]  Patient name: Angela Caldwell Medical record number: [id number] Date of birth: 06-18-1951 Age: 74 y.o. Gender: female  Primary Care Provider: Pcp, No Consultants: None Code Status: Full  Pt Overview and Major Events to Date:  1/16: Admitted  Assessment and Plan: PRR is a 74 year old F who was admitted for mechanical fall.   Assessment & Plan Fall Unable to ambulate Contusion of right hip Pain controlled.  - Pending PT/OT eval for dispo planning. - Tylenol  1000mg  q6h sch for pain  - consider oxycodone  for breakthrough pain  Hyponatremia Stable. - AM BMP Chronic health problem - Congenital intellectual delay: Continue memantine  10 mg and donepezil  10 mg daily - Seizure disorder: Continue divalproex  500 mg BID - Tremors: Continue primidone  50 mg BID - Constipation: Continue senna BID - Hyperlipidemia, Hx TIA: Continue rosuvastatin  5 mg, ASA 81 mg - GERD: Continue formulary equivalent of omeprazole 20 mg daily (pantoprazole  40 mg), famotidine  20 mg QHS - Anxiety: Continue escitalopram 20 mg daily - Department allergies: Continue cetirizine 10 mg formulary equivalent (loratadine  10 mg) - Gait instability, muscle weakness, scoliosis: Ambulate with walker - 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  Objective: Temp:  [97.4 F (36.3 C)-98.4 F (36.9 C)] 97.4 F (36.3 C) (01/17 0421) Pulse Rate:  [51-89] 51 (01/17 0421) Resp:  [14-25] 16 (01/17 0421) BP: (100-139)/(49-93) 111/59 (01/17 0421) SpO2:  [95 %-100 %] 96 % (01/17 0421) Weight:  [58.3 kg] 58.3 kg (01/16 1420) Physical Exam: General: Sleeping, easily awakens to voice.  NAD Cardiovascular: RRR Respiratory: Work of breathing on RA  Laboratory: Most recent CBC Lab Results  Component Value Date   WBC 9.1 09/18/2024   HGB 14.7 09/18/2024   HCT 41.6 09/18/2024   MCV  87.6 09/18/2024   PLT 247 09/18/2024   Most recent BMP    Latest Ref Rng & Units 09/19/2024    4:03 AM  BMP  Glucose 70 - 99 mg/dL 889   BUN 8 - 23 mg/dL 14   Creatinine 9.55 - 1.00 mg/dL 9.22   Sodium 864 - 854 mmol/L 130   Potassium 3.5 - 5.1 mmol/L 4.3   Chloride 98 - 111 mmol/L 98   CO2 22 - 32 mmol/L 25   Calcium  8.9 - 10.3 mg/dL 8.3     Elicia Hamlet, MD 09/19/2024, 4:45 AM  PGY-3, [facility] FPTS Intern pager: [phone], text pages welcome Secure chat group [facility] Teaching Service   "

## 2024-09-20 ENCOUNTER — Encounter (HOSPITAL_COMMUNITY): Payer: Self-pay | Admitting: Family Medicine

## 2024-09-20 ENCOUNTER — Other Ambulatory Visit: Payer: Self-pay

## 2024-09-20 DIAGNOSIS — E871 Hypo-osmolality and hyponatremia: Secondary | ICD-10-CM | POA: Diagnosis not present

## 2024-09-20 DIAGNOSIS — M79604 Pain in right leg: Secondary | ICD-10-CM | POA: Diagnosis not present

## 2024-09-20 LAB — BASIC METABOLIC PANEL WITH GFR
Anion gap: 9 (ref 5–15)
BUN: 14 mg/dL (ref 8–23)
CO2: 24 mmol/L (ref 22–32)
Calcium: 8.4 mg/dL — ABNORMAL LOW (ref 8.9–10.3)
Chloride: 98 mmol/L (ref 98–111)
Creatinine, Ser: 0.7 mg/dL (ref 0.44–1.00)
GFR, Estimated: >60 mL/min
Glucose, Bld: 91 mg/dL (ref 70–99)
Potassium: 4.5 mmol/L (ref 3.5–5.1)
Sodium: 131 mmol/L — ABNORMAL LOW (ref 135–145)

## 2024-09-20 NOTE — Discharge Summary (Deleted)
 "  Family Medicine Teaching [facility] Discharge Summary  Patient name: Angela Caldwell Medical record number: [id number] Date of birth: 1951-08-06 Age: 74 y.o. Gender: female Date of Admission: 09/18/2024  Date of Discharge: 09/20/24  Admitting Physician: Rollene FORBES Keeling, MD  Primary Care Provider: Pcp, No Consultants: none  Indication for Hospitalization: fall  Discharge Diagnoses/Problem List:  Principal Problem for Admission: fall Other Problems addressed during stay:  Principal Problem:   Fall Active Problems:   Chronic health problem   Hyponatremia   Unable to ambulate   Contusion of right hip   Pain of right lower extremity     Brief Hospital Course:  Angela Caldwell is a 74 y.o.female with a history of seizures, scoliosis, frequent falls who was admitted to the [facility] Medicine Teaching Service at [facility] for right leg pain following a fall at facility.   Her hospital course is detailed below:  Fall  Contusion of right hip Patient presented to ED from facility after a fall, likely due to chronic gait instability 2/2 ongoing right leg pain.  Fortunately imaging of right hip and femur showed no evidence of acute fracture.  CT head also without acute findings. Pain controlled with Tylenol . Patient stable upon discharge back to [facility].  Hyponatremia Sodium 127 on arrival.  Based on chart review this does appear to be chronic.  Thought possibly secondary to poor PO intake and/or chronic antiepileptic/SSRI use.  Patient did receive 1L IVF bolus in ED.  Sodium was monitored throughout the course of hospitalization and improved to 131.  Other chronic conditions were medically managed with home medications and formulary alternatives as necessary.   PCP Follow-up Recommendations: Patient discharged to [facility], follow up continued PT/OT/nursing needs     Results/Tests Pending at Time of Discharge:  Unresulted Labs (From admission, onward)     Start      Ordered   09/20/24 0352  MRSA Next Gen by PCR, Nasal  Once,   R        09/20/24 0352             Disposition: [facility]  Discharge Condition: stable  Discharge Exam:  Vitals:   09/19/24 2307 09/20/24 0428  BP: 117/71 (!) 108/53  Pulse: (!) 57 (!) 56  Resp: 18 20  Temp: 97.6 F (36.4 C) 97.6 F (36.4 C)  SpO2: 95% 96%   General: NAD awake  Cardiovascular: RRR Respiratory: normal WOB on RA. Clear anteriorly Abdomen: soft NTND  Significant Procedures: n/a  Significant Labs and Imaging:  No results for input(s): WBC, HGB, HCT, PLT in the last 48 hours. Recent Labs  Lab 09/18/24 1945 09/19/24 0403 09/20/24 0417  NA 131* 130* 131*  K 4.5 4.3 4.5  CL 98 98 98  CO2 24 25 24   GLUCOSE 166* 110* 91  BUN 11 14 14   CREATININE 0.64 0.77 0.70  CALCIUM  8.4* 8.3* 8.4*    CT Head wo contrast 1/16: IMPRESSION: 1. No acute intracranial CT findings within the limits of motion artifact. 2. No depressed skull fracture. 3. Severe enlargement of the lateral ventricles, unchanged. 4. 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   Electronically signed by: Francis Quam MD 09/18/2024 04:58 AM EST RP Workstation: [id number]   DG R Hip 1/16: IMPRESSION: 1. No acute findings. 2. Degenerative changes. Stable exam.   Electronically signed by: Francis Quam MD 09/18/2024 05:03 AM EST RP Workstation: [id number]   CT Hip R WO contrast 1/16 IMPRESSION: 1. No acute fracture.  2. Soft tissue contusion laterally  at the level of the right hip. 3. Mild bladder wall thickening versus underdistention with perivesical stranding and air in the anterior bladder, which can be seen with recent instrumentation, infection, or fistula; urinalysis is recommended for further evaluation. 4. Fibroid uterus.   Electronically signed by: Francis Quam MD 09/18/2024 06:14 AM EST RP Workstation: [id number]   DG femur R 1/16 IMPRESSION: Negative.     Electronically  Signed   By: Lynwood Landy Raddle M.D.   On: 09/18/2024 07:49    Discharge Medications:  Allergies as of 09/20/2024   No Known Allergies      Medication List     STOP taking these medications    amoxicillin 500 MG capsule Commonly known as: AMOXIL   hydrOXYzine 25 MG tablet Commonly known as: ATARAX       TAKE these medications    acetaminophen  650 MG CR tablet Commonly known as: TYLENOL  Take 650 mg by mouth every 6 (six) hours as needed for pain or fever.   albuterol  108 (90 Base) MCG/ACT inhaler Commonly known as: VENTOLIN  HFA Inhale 2 puffs into the lungs every 4 (four) hours as needed for wheezing or shortness of breath.   aspirin  EC 81 MG tablet Take 81 mg by mouth daily.   cetirizine 10 MG tablet Commonly known as: ZYRTEC Take 10 mg by mouth daily.   chlorhexidine 0.12 % solution Commonly known as: PERIDEX Use as directed 30 mLs in the mouth or throat at bedtime.   citalopram  20 MG tablet Commonly known as: CELEXA  Take 20 mg by mouth daily.   divalproex  250 MG 24 hr tablet Commonly known as: DEPAKOTE  ER Take 500 mg by mouth in the morning and at bedtime.   donepezil  10 MG tablet Commonly known as: ARICEPT  Take 10 mg by mouth at bedtime.   famotidine  20 MG tablet Commonly known as: PEPCID  Take 20 mg by mouth at bedtime.   guaifenesin  100 MG/5ML syrup Commonly known as: ROBITUSSIN Take 200 mg by mouth every 4 (four) hours as needed for cough.   loperamide 2 MG capsule Commonly known as: IMODIUM Take 2 mg by mouth 3 (three) times daily as needed for diarrhea or loose stools.   memantine  10 MG tablet Commonly known as: NAMENDA  Take 10 mg by mouth daily.   Midazolam  5 MG/0.1ML Soln Place 5 mg into the nose as needed (seizure).   omeprazole 20 MG capsule Commonly known as: PRILOSEC Take 20 mg by mouth daily.   primidone  50 MG tablet Commonly known as: MYSOLINE  Take 50 mg by mouth in the morning and at bedtime.   rosuvastatin  5 MG  tablet Commonly known as: CRESTOR  Take 5 mg by mouth daily.   senna-docusate 8.6-50 MG tablet Commonly known as: Senokot-S Take 2 tablets by mouth at bedtime.   sennosides-docusate sodium  8.6-50 MG tablet Commonly known as: SENOKOT-S Take 2 tablets by mouth 2 (two) times daily as needed for constipation.   Vitamin D (Ergocalciferol) 1.25 MG (50000 UNIT) Caps capsule Commonly known as: DRISDOL Take 50,000 Units by mouth every 7 (seven) days. Friday        Discharge Instructions: Please refer to Patient Instructions section of EMR for full details.  Patient was counseled important signs and symptoms that should prompt return to medical care, changes in medications, dietary instructions, activity restrictions, and follow up appointments.   Follow-Up Appointments:   Romelle Booty, MD 09/20/2024, 10:06 AM PGY-3, [facility] "

## 2024-09-20 NOTE — Plan of Care (Signed)
" °  Problem: Education: Goal: Knowledge of General Education information will improve Description: Including pain rating scale, medication(s)/side effects and non-pharmacologic comfort measures Outcome: Progressing   Problem: Activity: Goal: Risk for activity intolerance will decrease Outcome: Progressing   Problem: Coping: Goal: Level of anxiety will decrease Outcome: Progressing   Problem: Nutrition: Goal: Adequate nutrition will be maintained Outcome: Progressing   Problem: Elimination: Goal: Will not experience complications related to bowel motility Outcome: Progressing Goal: Will not experience complications related to urinary retention Outcome: Progressing   Problem: Skin Integrity: Goal: Risk for impaired skin integrity will decrease Outcome: Progressing   Problem: Safety: Goal: Ability to remain free from injury will improve Outcome: Progressing   "

## 2024-09-20 NOTE — Assessment & Plan Note (Signed)
 Pain controlled.  - continue PT/OT - appreciate TOC for dispo planning - Tylenol  1000mg  q6h sch for pain  - consider oxycodone  for breakthrough pain

## 2024-09-20 NOTE — Progress Notes (Signed)
" ° ° ° °  Daily Progress Note Intern Pager: [phone]  Patient name: Angela Caldwell Medical record number: [id number] Date of birth: 1950-09-18 Age: 74 y.o. Gender: female  Primary Care Provider: Pcp, No Consultants: None Code Status: Full  Pt Overview and Major Events to Date:  1/16 admitted  Medical Decision Making:  Angela Caldwell is a 74 y.o. female admitted after fall, imaging negative for fracture. Medically stable for discharge back to [facility].  Assessment & Plan Fall Unable to ambulate Contusion of right hip Pain controlled.  - continue PT/OT - appreciate TOC for dispo planning - Tylenol  1000mg  q6h sch for pain  - consider oxycodone  for breakthrough pain  Hyponatremia Stable, improving Chronic health problem - Congenital intellectual delay: Continue memantine  10 mg and donepezil  10 mg daily - Seizure disorder: Continue divalproex  500 mg BID - Tremors: Continue primidone  50 mg BID - Constipation: Continue senna BID - Hyperlipidemia, Hx TIA: Continue rosuvastatin  5 mg, ASA 81 mg - GERD: Continue formulary equivalent of omeprazole 20 mg daily (pantoprazole  40 mg), famotidine  20 mg QHS - Anxiety: Continue escitalopram 20 mg daily - Department allergies: Continue cetirizine 10 mg formulary equivalent (loratadine  10 mg) - Gait instability, muscle weakness, scoliosis: Ambulate with walker - 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 Clear anteriorly Abdomen: soft NTND  Laboratory: Most recent CBC Lab Results  Component Value Date   WBC  9.1 09/18/2024   HGB 14.7 09/18/2024   HCT 41.6 09/18/2024   MCV 87.6 09/18/2024   PLT 247 09/18/2024   Most recent BMP    Latest Ref Rng & Units 09/20/2024    4:17 AM  BMP  Glucose 70 - 99 mg/dL 91   BUN 8 - 23 mg/dL 14   Creatinine 9.55 - 1.00 mg/dL 9.29   Sodium 864 - 854 mmol/L 131   Potassium 3.5 - 5.1 mmol/L 4.5   Chloride 98 - 111 mmol/L 98   CO2 22 - 32 mmol/L 24   Calcium  8.9 - 10.3 mg/dL 8.4     Romelle Booty, MD 09/20/2024, 8:58 AM  PGY-3, [facility] FPTS Intern pager: [phone], text pages welcome Secure chat group [facility] Teaching Service  "

## 2024-09-20 NOTE — Assessment & Plan Note (Signed)
-   Congenital intellectual delay: Continue memantine  10 mg and donepezil  10 mg daily - Seizure disorder: Continue divalproex  500 mg BID - Tremors: Continue primidone  50 mg BID - Constipation: Continue senna BID - Hyperlipidemia, Hx TIA: Continue rosuvastatin  5 mg, ASA 81 mg - GERD: Continue formulary equivalent of omeprazole 20 mg daily (pantoprazole  40 mg), famotidine  20 mg QHS - Anxiety: Continue escitalopram 20 mg daily - Department allergies: Continue cetirizine 10 mg formulary equivalent (loratadine  10 mg) - Gait instability, muscle weakness, scoliosis: Ambulate with walker - Chronically enlarged ventricles: Shunt in place on imaging

## 2024-09-20 NOTE — Assessment & Plan Note (Signed)
 Stable, improving

## 2024-09-20 NOTE — TOC Transition Note (Addendum)
 Transition of Care [facility]) - Discharge Note   Patient Details  Name: Angela Caldwell MRN: [id number] Date of Birth: 10-20-1950  Transition of Care [facility]) CM/SW Contact:  Cena Ligas, LCSW Phone Number: 09/20/2024, 10:29 AM   Clinical Narrative:     11:08am- DC cancelled for today, facility has been notified.   Per MD patient ready for DC to [facility]. RN, patient, patient's family, and facility notified of DC. Discharge Summary and FL2 sent to facility. DC packet on chart. patient.    RN to call report to [phone].   CSW will sign off for now as social work intervention is no longer needed. Please consult us  again if new needs arise.   Final next level of care: Skilled Nursing Facility Barriers to Discharge: Barriers Resolved   Patient Goals and CMS Choice     Choice offered to / list presented to :  (sister [name])      Discharge Placement              Patient chooses bed at: Other - please specify in the comment section below: [facility]) Patient to be transferred to facility by: PTAR Name of family member notified: sister- [name] Patient and family notified of of transfer: 09/20/24  Discharge Plan and Services Additional resources added to the After Visit Summary for   In-house Referral: Clinical Social Work   Post Acute Care Choice: Skilled Nursing Facility                               Social Drivers of Health (SDOH) Interventions SDOH Screenings   Food Insecurity: No Food Insecurity (09/18/2024)  Housing: Low Risk (09/18/2024)  Transportation Needs: No Transportation Needs (09/18/2024)  Utilities: Not At Risk (09/18/2024)  Social Connections: Socially Isolated (09/18/2024)  Tobacco Use: Low Risk (09/20/2024)     Readmission Risk Interventions     No data to display

## 2024-09-20 NOTE — Discharge Instructions (Signed)
 Dear Avelina Clubs,  Thank you for letting us  participate in your care. You were hospitalized for  Fall. You were treated with physical therapy.   POST-HOSPITAL & CARE INSTRUCTIONS Please review your medication list Go to your follow up appointments (listed below)   DOCTOR'S APPOINTMENT   No future appointments.   Take care and be well!  Family Medicine Teaching Service Inpatient Team [place]  [facility]  [address] [place], [place] [address] [phone]

## 2024-09-20 NOTE — Discharge Summary (Shared)
 "  Family Medicine Teaching [facility] Discharge Summary  Patient name: Angela Caldwell Medical record number: [id number] Date of birth: 03-04-1951 Age: 74 y.o. Gender: female Date of Admission: 09/18/2024  Date of Discharge: 09/21/2024 Admitting Physician: Rollene FORBES Keeling, MD  Primary Care Provider: Pcp, No Consultants: none  Indication for Hospitalization: fall  Discharge Diagnoses/Problem List:  Principal Problem for Admission: fall Other Problems addressed during stay:  Principal Problem:   Fall Active Problems:   Chronic health problem   Hyponatremia   Unable to ambulate   Contusion of right hip   Pain of right lower extremity   Brief Hospital Course:  Angela Caldwell is a 74 y.o.female with a history of seizures, scoliosis, frequent falls who was admitted to the [facility] Medicine Teaching Service at [facility] for right leg pain following a fall at facility.   Her hospital course is detailed below:  Fall  Contusion of right hip Patient presented to ED from facility after a fall, likely due to chronic gait instability 2/2 ongoing right leg pain.  Fortunately imaging of right hip and femur showed no evidence of acute fracture.  CT head also without acute findings. Pain controlled with Tylenol . Patient stable upon discharge back to [facility].  Hyponatremia Sodium 127 on arrival.  Based on chart review this does appear to be chronic.  Thought possibly secondary to poor PO intake and/or chronic antiepileptic/SSRI use.  Patient did receive 1L IVF bolus in ED.  Sodium was monitored throughout the course of hospitalization and improved to 131 on discharge.  Other chronic conditions were medically managed with home medications and formulary alternatives as necessary.   PCP Follow-up Recommendations: Patient discharged to [facility], follow up continued PT/OT/nursing needs  Results/Tests Pending at Time of Discharge:  Unresulted Labs (From admission, onward)     Start      Ordered   09/20/24 0352  MRSA Next Gen by PCR, Nasal  Once,   R        09/20/24 0352           Disposition: [facility]  Discharge Condition: Stable  Discharge Exam:  Vitals:   09/21/24 0322 09/21/24 0849  BP: (!) 134/56 126/68  Pulse: (!) 50 (!) 53  Resp: 15 18  Temp: (!) 97.4 F (36.3 C) 97.6 F (36.4 C)  SpO2: 98% 99%   General: A&O, NAD HEENT: No sign of trauma, EOM grossly intact Cardiac: RRR, no m/r/g Respiratory: CTAB, normal WOB, no w/c/r GI: Soft, NTTP, non-distended  Extremities: NTTP, no peripheral edema. Neuro: moves all four extremities appropriately.  Significant Labs and Imaging:  No results for input(s): WBC, HGB, HCT, PLT in the last 48 hours. Recent Labs  Lab 09/20/24 0417  NA 131*  K 4.5  CL 98  CO2 24  GLUCOSE 91  BUN 14  CREATININE 0.70  CALCIUM  8.4*   CT Head wo contrast 1/16: IMPRESSION: 1. No acute intracranial CT findings within the limits of motion artifact. 2. No depressed skull fracture. 3. Severe enlargement of the lateral ventricles, unchanged. 4. 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   DG R Hip 1/16: IMPRESSION: 1. No acute findings. 2. Degenerative changes. Stable exam.  CT Hip R WO contrast 1/16 IMPRESSION: 1. No acute fracture. 2. Soft tissue contusion laterally  at the level of the right hip. 3. Mild bladder wall thickening versus underdistention with perivesical stranding and air in the anterior bladder, which can be seen with recent instrumentation, infection, or fistula; urinalysis  is recommended for further evaluation. 4. Fibroid uterus.   DG femur R 1/16 IMPRESSION: Negative.   Discharge Medications:  Allergies as of 09/21/2024   No Known Allergies      Medication List     STOP taking these medications    amoxicillin 500 MG capsule Commonly known as: AMOXIL   hydrOXYzine 25 MG tablet Commonly known as: ATARAX       TAKE these medications    acetaminophen  650 MG  CR tablet Commonly known as: TYLENOL  Take 650 mg by mouth every 6 (six) hours as needed for pain or fever.   albuterol  108 (90 Base) MCG/ACT inhaler Commonly known as: VENTOLIN  HFA Inhale 2 puffs into the lungs every 4 (four) hours as needed for wheezing or shortness of breath.   aspirin  EC 81 MG tablet Take 81 mg by mouth daily.   cetirizine 10 MG tablet Commonly known as: ZYRTEC Take 10 mg by mouth daily.   chlorhexidine 0.12 % solution Commonly known as: PERIDEX Use as directed 30 mLs in the mouth or throat at bedtime.   citalopram  20 MG tablet Commonly known as: CELEXA  Take 20 mg by mouth daily.   divalproex  250 MG 24 hr tablet Commonly known as: DEPAKOTE  ER Take 500 mg by mouth in the morning and at bedtime.   donepezil  10 MG tablet Commonly known as: ARICEPT  Take 10 mg by mouth at bedtime.   famotidine  20 MG tablet Commonly known as: PEPCID  Take 20 mg by mouth at bedtime.   guaifenesin  100 MG/5ML syrup Commonly known as: ROBITUSSIN Take 200 mg by mouth every 4 (four) hours as needed for cough.   loperamide 2 MG capsule Commonly known as: IMODIUM Take 2 mg by mouth 3 (three) times daily as needed for diarrhea or loose stools.   memantine  10 MG tablet Commonly known as: NAMENDA  Take 10 mg by mouth daily.   Midazolam  5 MG/0.1ML Soln Place 5 mg into the nose as needed (seizure).   omeprazole 20 MG capsule Commonly known as: PRILOSEC Take 20 mg by mouth daily.   primidone  50 MG tablet Commonly known as: MYSOLINE  Take 50 mg by mouth in the morning and at bedtime.   rosuvastatin  5 MG tablet Commonly known as: CRESTOR  Take 5 mg by mouth daily.   senna-docusate 8.6-50 MG tablet Commonly known as: Senokot-S Take 2 tablets by mouth at bedtime.   sennosides-docusate sodium  8.6-50 MG tablet Commonly known as: SENOKOT-S Take 2 tablets by mouth 2 (two) times daily as needed for constipation.   Vitamin D (Ergocalciferol) 1.25 MG (50000 UNIT) Caps  capsule Commonly known as: DRISDOL Take 50,000 Units by mouth every 7 (seven) days. Friday       Discharge Instructions: Please refer to Patient Instructions section of EMR for full details.  Patient was counseled important signs and symptoms that should prompt return to medical care, changes in medications, dietary instructions, activity restrictions, and follow up appointments.   Follow-Up Appointments:   Lupie Credit, DO 09/21/2024, 11:35 AM PGY-1, [facility]   Upper Level Addendum: I have seen and evaluated this patient along with Dr. Lupie and reviewed the above note, making necessary revisions as appropriate. I agree with the medical decision making and physical exam as noted above.  Lauraine Norse, DO [facility], PGY-2 09/21/24 1:27 PM  FPTS Intern pager: [phone], text pages welcome Secure chat group [facility] Teaching Service   "

## 2024-09-21 DIAGNOSIS — E871 Hypo-osmolality and hyponatremia: Secondary | ICD-10-CM | POA: Diagnosis not present

## 2024-09-21 DIAGNOSIS — M79604 Pain in right leg: Secondary | ICD-10-CM | POA: Diagnosis not present

## 2024-09-21 NOTE — Progress Notes (Signed)
 Report called & AVS reviewed with nurse Merlynn at [facility].

## 2024-09-21 NOTE — TOC Transition Note (Signed)
 Transition of Care [facility]) - Discharge Note   Patient Details  Name: Angela Caldwell MRN: [id number] Date of Birth: 02-27-1951  [facility] CM/SW Contact:  Bridget Cordella Simmonds, LCSW Phone Number: 09/21/2024, 12:08 PM   Clinical Narrative:   Pt discharging to [facility], room 100B. RN call report to [phone].   PTAR called 1205.   0815: CSW confirmed with [facility]: they can receive pt today.  They do not need CSW to start auth for STR, will do at the facility.  Final next level of care: Skilled Nursing Facility Barriers to Discharge: Barriers Resolved   Patient Goals and CMS Choice     Choice offered to / list presented to :  (sister [name])      Discharge Placement              Patient chooses bed at:  [facility]) Patient to be transferred to facility by: PTAR Name of family member notified: sister [name] Patient and family notified of of transfer: 09/21/24  Discharge Plan and Services Additional resources added to the After Visit Summary for   In-house Referral: Clinical Social Work   Post Acute Care Choice: Skilled Nursing Facility                               Social Drivers of Health (SDOH) Interventions SDOH Screenings   Food Insecurity: No Food Insecurity (09/18/2024)  Housing: Low Risk (09/18/2024)  Transportation Needs: No Transportation Needs (09/18/2024)  Utilities: Not At Risk (09/18/2024)  Social Connections: Socially Isolated (09/18/2024)  Tobacco Use: Low Risk (09/20/2024)     Readmission Risk Interventions     No data to display
